# Patient Record
Sex: Female | Born: 1985
Health system: Southern US, Community
[De-identification: ages and names within clinical notes are randomized; demographics above are authoritative.]

## PROBLEM LIST (undated history)

## (undated) DIAGNOSIS — E039 Hypothyroidism, unspecified: Secondary | ICD-10-CM

## (undated) DIAGNOSIS — A749 Chlamydial infection, unspecified: Secondary | ICD-10-CM

## (undated) DIAGNOSIS — R87629 Unspecified abnormal cytological findings in specimens from vagina: Secondary | ICD-10-CM

## (undated) HISTORY — DX: Hypothyroidism, unspecified: E03.9

---
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
## 2008-10-15 ENCOUNTER — Ambulatory Visit: Payer: Self-pay | Admitting: Family Medicine

## 2008-12-31 ENCOUNTER — Ambulatory Visit: Payer: Self-pay | Admitting: Family Medicine

## 2009-01-02 DIAGNOSIS — E039 Hypothyroidism, unspecified: Secondary | ICD-10-CM

## 2009-01-02 LAB — CONVERTED CEMR LAB
BUN: 20 mg/dL (ref 6–23)
CO2: 22 meq/L (ref 19–32)
Calcium: 8.7 mg/dL (ref 8.4–10.5)
Chloride: 105 meq/L (ref 96–112)
Cholesterol: 159 mg/dL (ref 0–200)
Creatinine, Ser: 0.73 mg/dL (ref 0.40–1.20)
Glucose, Bld: 87 mg/dL (ref 70–99)
HDL: 36 mg/dL — ABNORMAL LOW (ref >39)
LDL Cholesterol: 102 mg/dL — ABNORMAL HIGH (ref 0–99)
Potassium: 4 meq/L (ref 3.5–5.3)
Prolactin: 10.4 ng/mL
Sodium: 140 meq/L (ref 135–145)
TSH: 7.208 microintl units/mL — ABNORMAL HIGH (ref 0.350–4.50)
Total CHOL/HDL Ratio: 4.4
Triglycerides: 103 mg/dL (ref <150)
VLDL: 21 mg/dL (ref 0–40)

## 2009-01-03 ENCOUNTER — Ambulatory Visit: Payer: Self-pay | Admitting: Family Medicine

## 2009-01-06 LAB — CONVERTED CEMR LAB
Free T4: 1.02 ng/dL (ref 0.89–1.80)
T3, Free: 3.7 pg/mL (ref 2.3–4.2)
TSH: 3.604 microintl units/mL (ref 0.350–4.50)

## 2010-10-28 ENCOUNTER — Ambulatory Visit: Payer: Self-pay | Admitting: Family Medicine

## 2010-10-28 LAB — CONVERTED CEMR LAB
Chlamydia, DNA Probe: NEGATIVE
Free T4: 0.9 ng/dL (ref 0.80–1.80)
GC Probe Amp, Genital: NEGATIVE
HCT: 35.4 % — ABNORMAL LOW (ref 36.0–46.0)
Hemoglobin: 11.8 g/dL — ABNORMAL LOW (ref 12.0–15.0)
Iron: 55 ug/dL (ref 42–145)
MCHC: 33.3 g/dL (ref 30.0–36.0)
MCV: 90.3 fL (ref 78.0–100.0)
Pap Smear: NEGATIVE
Platelets: 266 10*3/uL (ref 150–400)
RBC: 3.92 M/uL (ref 3.87–5.11)
RDW: 14.2 % (ref 11.5–15.5)
Saturation Ratios: 14 % — ABNORMAL LOW (ref 20–55)
T3, Free: 2.6 pg/mL (ref 2.3–4.2)
TIBC: 405 ug/dL (ref 250–470)
TSH: 16.783 microintl units/mL — ABNORMAL HIGH (ref 0.350–4.500)
Testosterone: 40.6 ng/dL (ref 10–70)
UIBC: 350 ug/dL
WBC: 7.8 10*3/uL (ref 4.0–10.5)

## 2010-10-29 ENCOUNTER — Telehealth: Payer: Self-pay | Admitting: Family Medicine

## 2010-10-29 ENCOUNTER — Encounter: Payer: Self-pay | Admitting: Family Medicine

## 2010-10-29 ENCOUNTER — Ambulatory Visit: Payer: Self-pay | Admitting: Family Medicine

## 2010-10-29 LAB — CONVERTED CEMR LAB
Free T4: 0.68 ng/dL — ABNORMAL LOW (ref 0.80–1.80)
T3, Free: 2.3 pg/mL (ref 2.3–4.2)
TSH: 13.414 microintl units/mL — ABNORMAL HIGH (ref 0.350–4.500)

## 2010-10-30 ENCOUNTER — Telehealth: Payer: Self-pay | Admitting: Family Medicine

## 2010-10-30 ENCOUNTER — Encounter: Payer: Self-pay | Admitting: Family Medicine

## 2011-01-19 NOTE — Letter (Signed)
Summary: Generic Letter  [facility]  [address]   [place], [place] [address]   Phone: [phone]  Fax: [phone]    10/30/2010  [facility] [address] [place], [place]  [address]  [place],   [place] escribo con buenas noticias acerca del Papanicolau que hicimos en la [place].  El estudio salio' negativo.    Le recomiendo que vuelva a hacerse la prueba en un ano.      Sinceramente,   Paula Compton MD  Appended Document: Generic Letter mailed

## 2011-01-19 NOTE — Assessment & Plan Note (Signed)
Summary: CPE/KH   Vital Signs:  Patient profile:   25 year old female Height:      60.5 inches Weight:      146.7 pounds BMI:     28.28 Temp:     98.3 degrees F Pulse rate:   71 / minute BP sitting:   95 / 62  Vitals Entered By: Starleen Blue RN (October 28, 2010 4:20 PM) CC: cpe Is Patient Diabetic? No Pain Assessment Patient in pain? no        CC:  cpe.  History of Present Illness: Visit conducted in [place] .  Since last visit, Heather Mejia has lost over 40 lbs intentionally.  She runs regularly and uses stairmaster. ALso watching diet.  Not eating at night anymore.  Used to work nights, now works days.   Has concern over diffuse hair loss.  Happens when she runs her hands through her hair, or combs it. No hair dye or treatment products.  Usually leaves it free, occasionally ties it back.  No focal area of loss, no excessive dandruff.  No skin changes elsewhere.   Complains of R anterior shin pain, worse when squats down or is running/stairmaster excessively.  No pain right now.  Goes away wiht rest.   Habits & Providers  Alcohol-Tobacco-Diet     Tobacco Status: never  Social History: Single, lives with both parents and siblings, son.  Father of her son is involved, not living with her. SHe works in Education officer, environmental.  Never Smoked Alcohol use-no Regular exercise-yes  Oct 28, 2010; Never smoked. LIves with son (age 51).  Implanon. Exercises regularly (lost 42# intentionally since last visit). RUns and does stairmaster.   Physical Exam  General:  well appearing, no apparent dsitress Eyes:  clear sclerae Mouth:  moist mucus membranes.  Clear oropharynx Neck:  neck supple. Thyroid enlargement noted.  No focal nodularity or adenopathy. FUll ROM Lungs:  Normal respiratory effort, chest expands symmetrically. Lungs are clear to auscultation, no crackles or wheezes. Heart:  Normal rate and regular rhythm. S1 and S2 normal without gallop, murmur, click, rub or other extra  sounds. Abdomen:  soft, nontender.  Genitalia:  normal vaginal mucosa without lesions.  Cervix smooth and not friable. PAP collected.  Copious white discharge @os .  Cervical cultures collected.  Msk:  R shin no bony tenderness; full active ROM knees and ankles bilaterally.  Pulses:  palpable dp pulses bilaterally.  Extremities:  no skin lesions or edema noted in ankles.  Neurologic:  gait normal.     Impression & Recommendations:  Problem # 1:  UNSPECIFIED ALOPECIA (ICD-704.00) Most likely telogen effluvium; will order TFTs and iron studies, RPR, as workup (especially in light of thyomegaly on exam today).  The rapid/dramatic weight loss may explain the telogen effluvium; also emotional changes.  Will discuss with her once labs are back [phone] cell).  Of note, galactorrhea is resolved since last visit.  Orders: CBC-[facility] (04540) Iron Binding Cap (TIBC)-[facility] ([id number]) Iron -[facility] [phone]) Testosterone-[facility] [phone]) TSH-[facility] [phone]) RPR-[facility] [phone]) [facility]- Est  Level 4 (03474)  Problem # 2:  THYROMEGALY (ICD-240.9)  Diffusely enlarged thyroid.  No nodules noted. TFTs today.  Decision regarding imaging once TFTs are back   Orders: [facility]- Est  Level 4 (99214)  Problem # 3:  SCREENING FOR MALIGNANT NEOPLASM OF THE CERVIX (ICD-V76.2) Patient overdue for PAP.  Implanon since early 2010.  Cervical cx collected due to discharge upon speculum exam.  Orders: Pap Smear-[facility] ([id number])  Problem # 4:  SHIN SPLINTS (ICD-844.9)  No point tenderness, resolves between heavy use periods.  No pain on exam today.  Discussed NSAIDs, stretching, avoiding overuse.  For more followup if rapid and severe recurrence.   Orders: [facility]- Est  Level 4 (95284)  Complete Medication List: 1)  Clotrimazole 1 % Crea (Clotrimazole) .... Sig: apply to affected area once daily after bathing, as directed disp 30g tube  Other Orders: Free T4-[facility] [phone]) Free T3-[facility]  [phone]) GC/Chlamydia-[facility] (87591/87491) Pap Smear (74259)  Patient Instructions: 1)  Fue un placer verle hoy.   2)  Para el dolor en la pierna derecha, quiero que se estire antes de Artist ejercicio fuerte.  Puede tomar ibuprofen 200mg , tome 2 a 4 tabletas cada 6 horas con algo de comer hasta que se le mejore.  Trate de descansar del ejercicio cuando le duele.  3)  Para la caida del pelo, estoy haciendo unas pruebas de [place].  Le llamo o escribo para comunicarle los [place], tanto de la sangre como del Papanicolau y cultivos cervicales que sacamos hoy.    Orders Added: 1)  CBC-[facility] [85027] 2)  Iron Binding Cap (TIBC)-[facility] [[address]] 3)  Iron -[facility] [[id number]] 4)  Testosterone-[facility] [60630-16010] 5)  TSH-[facility] [93235-57322] 6)  RPR-[facility] [02542-70623] 7)  Free T4-[facility] [76283-15176] 8)  Free T3-[facility] [16073-71062] 9)  GC/Chlamydia-[facility] [87591/87491] 10)  Pap Smear [88150] 11)  Pap Smear-[facility] [69485-46270] 12)  [facility]- Est  Level 4 [35009]     Vital Signs:  Patient profile:   25 year old female Height:      60.5 inches Weight:      146.7 pounds BMI:     28.28 Temp:     98.3 degrees F Pulse rate:   71 / minute BP sitting:   95 / 62  Vitals Entered By: Starleen Blue RN (October 28, 2010 4:20 PM)

## 2011-01-19 NOTE — Progress Notes (Signed)
  Phone Note Outgoing Call Call back at [facility] Phone [phone]   Call placed by: Paula Compton MD,  October 30, 2010 10:29 AM Call placed to: Patient Summary of Call: Called patient to report confirmation of primary hypothyroidism by labs.  To start LT4 daily, she wouldl ike this sent to [facility] and [employer] [facility].  Rx sent.  Patient knows to call for appt in 2 months for physician visit and for repeat TSH draw. Call conducted in Spanish. Initial call taken by: Paula Compton MD,  October 30, 2010 10:30 AM    New/Updated Medications: LEVOTHYROXINE SODIUM 100 MCG TABS (LEVOTHYROXINE SODIUM) SIG: Take 1 tab by mouth one time daily Spanish lang instructions Prescriptions: LEVOTHYROXINE SODIUM 100 MCG TABS (LEVOTHYROXINE SODIUM) SIG: Take 1 tab by mouth one time daily Spanish lang instructions  #30 x 3   Entered and Authorized by:   Paula Compton MD   Signed by:   Paula Compton MD on 10/30/2010   Method used:   Electronically to        [id number] (retail)       [address]       [place], [place]  [id number]       Ph: [phone]       Fax: [phone]   RxID:   [id number]

## 2011-01-19 NOTE — Progress Notes (Signed)
  Phone Note Outgoing Call Call back at [facility] Phone [phone]   Call placed by: Paula Compton MD,  October 29, 2010 9:34 AM Call placed to: Patient Summary of Call: [phone] cell  Called patient, conversation in Spanish with patient regarding elev TSH and low-normal free T3 and T4.  To recheck TSH, Free T4/T3, and likely initiate LT4 at daily, then recheck in 6 to 8 weeks.  Discussed with patient, will call tomorrow with the results and schedule for followup in 2 months.  She is going to [facility] today to reinstate orange card coverage.  Initial call taken by: Paula Compton MD,  October 29, 2010 9:41 AM

## 2011-02-10 ENCOUNTER — Encounter: Payer: Self-pay | Admitting: *Deleted

## 2011-02-19 ENCOUNTER — Encounter: Payer: Self-pay | Admitting: Family Medicine

## 2011-02-19 ENCOUNTER — Ambulatory Visit (INDEPENDENT_AMBULATORY_CARE_PROVIDER_SITE_OTHER): Payer: Self-pay | Admitting: Family Medicine

## 2011-02-19 VITALS — BP 95/65 | HR 56 | Temp 98.2°F | Ht 60.63 in | Wt 156.0 lb

## 2011-02-19 DIAGNOSIS — K602 Anal fissure, unspecified: Secondary | ICD-10-CM

## 2011-02-19 DIAGNOSIS — E039 Hypothyroidism, unspecified: Secondary | ICD-10-CM

## 2011-02-19 LAB — CBC
HCT: 36.1 % (ref 36.0–46.0)
Hemoglobin: 11.5 g/dL — ABNORMAL LOW (ref 12.0–15.0)
MCH: 29 pg (ref 26.0–34.0)
MCHC: 31.9 g/dL (ref 30.0–36.0)
MCV: 90.9 fL (ref 78.0–100.0)
Platelets: 260 10*3/uL (ref 150–400)
RBC: 3.97 MIL/uL (ref 3.87–5.11)
RDW: 14.6 % (ref 11.5–15.5)
WBC: 5.7 10*3/uL (ref 4.0–10.5)

## 2011-02-19 LAB — CONVERTED CEMR LAB
HCT: 36.1 % (ref 36.0–46.0)
Hemoglobin: 11.5 g/dL — ABNORMAL LOW (ref 12.0–15.0)
MCHC: 31.9 g/dL (ref 30.0–36.0)
MCV: 90.9 fL (ref 78.0–100.0)
Platelets: 260 10*3/uL (ref 150–400)
RBC: 3.97 M/uL (ref 3.87–5.11)
RDW: 14.6 % (ref 11.5–15.5)
TSH: 1.921 microintl units/mL (ref 0.350–4.500)
WBC: 5.7 10*3/uL (ref 4.0–10.5)

## 2011-02-19 LAB — TSH: TSH: 1.921 u[IU]/mL (ref 0.350–4.500)

## 2011-02-19 MED ORDER — HYDROCORTISONE 2.5 % RE CREA
TOPICAL_CREAM | RECTAL | Status: AC
Start: 2011-02-19 — End: 2012-02-19

## 2011-02-19 NOTE — Progress Notes (Signed)
  Subjective:    Patient ID: Heather Mejia, female    DOB: 1986/10/27, 25 y.o.   MRN: [id number]  HPI Visit conducted in Spanish. Heather Mejia comes in for a follow up of her hypothyroidism.  Was started on LT4 in November, set for recheck in 2 months.  Today is her next follow up. She reports that she feels better since starting LT4; hair loss is much improved, has more energy.  Sleeping better (goes to bed at 9pm, sleeps until 7am without interruption).  Feels energetic in the morning.  Denies depressive symptoms or anhedonia.  [facility]; would like to change out the Implanon now.  Has appt with [facility] for Monday, March 5th but would like to know if can be done here.  I researched and found out that [employer] is $65 for this service here.  She believes it is without charge at [employer].   LMP 02/10/2011: light flow, lasts 7 days. Somewhat irregular (may have lighter flow on some months).   Working in Education officer, environmental.  Nonsmoker.     Review of Systems Remarks that she sometimes has pain with defecation.  No rectal pain other than in the moment of active defecation.  No blood or mucus on stool.  No constipation or hard stool.  No diarrhea.     Objective:   Physical Exam  Constitutional: She appears well-developed and well-nourished.  HENT:  Head: Normocephalic and atraumatic.  Eyes: Conjunctivae are normal. Pupils are equal, round, and reactive to light. Right eye exhibits no discharge. Left eye exhibits no discharge. No scleral icterus.  Neck: Normal range of motion. Neck supple. No tracheal deviation present. No thyromegaly present.  Cardiovascular: Normal rate, regular rhythm and normal heart sounds.   Pulmonary/Chest: Effort normal. No respiratory distress. She has no wheezes. She has no rales.  Abdominal: Soft. Bowel sounds are normal.       Rectal exam: small anal fissure at 6 oclock; no masses, no hemorrhoids on DRE or on inspection. No blood on  glove.   Musculoskeletal:       No ankle edema bilaterally. Palpable dp pulses bilaterally  Lymphadenopathy:    She has no cervical adenopathy.          Assessment & Plan:

## 2011-02-19 NOTE — Assessment & Plan Note (Signed)
Patient reports improved symptoms since starting LT4.  For lab check of TSH today. May adjust meds according to results. She agrees to this plan.

## 2011-02-19 NOTE — Patient Instructions (Addendum)
Fue un Research officer, trade union.  Estamos chequeando los laboratorios de la tiroide, el conteo de [place].   Le llamo al [phone] para darle los [place].   Averigue' acerca del implanon.  Para ponerselo aqui, hay un cobro de $65.  Si quiere hacerlo aqui, hay que marcar una cita con un doctor que pone el implanon.   Para la fisura anal: Mande' una receta para una crema, para aplicarse dos veces por dia.  Tambien, sientese en la banadera con agua tibia con sales de [employer] (sales curativas) dos a tres veces por dia.  Coma fibra (en forma de polvo/suplemento como Metamucil), tambien por la alimentacion.  Quiero que vuelva en entre 1 y 2 meses.   FOLLOW UP WITH DR Mauricio Po IN 1 TO 2 MONTHS.  IF PATIENT DESIRES IMPLANON TO BE CHANGED HERE IN [facility], TO MAKE APPT WITH PHYSICIAN WHO DOES IMPLANON.

## 2011-02-19 NOTE — Assessment & Plan Note (Addendum)
Anal fissure at 6 oclock on exam.  No internal or external hemorrhoids.  No report of bloody stool.  Will prescribe anusol HC, fiber and sitz baths.  Follow up in 1-2 months.

## 2011-02-22 ENCOUNTER — Telehealth: Payer: Self-pay | Admitting: Family Medicine

## 2011-02-22 MED ORDER — LEVOTHYROXINE SODIUM 100 MCG PO TABS: 100.0000 ug | ORAL_TABLET | Freq: Every day | ORAL | Status: DC

## 2011-02-22 NOTE — Telephone Encounter (Signed)
Call completed, conversation in [place].  Related results of TSH to patient; well controlled hypothyroidism, will continue with same dose of LT4, recheck in 1 year.  Also, patient does not have another appointment with me.  She did not pick up the HC cream at the pharmacy.  I asked her to please call if her pain with defecation is not resolved within 2 weeks.  She agrees.

## 2011-02-25 ENCOUNTER — Ambulatory Visit
Admission: RE | Admit: 2011-02-25 | Discharge: 2011-02-25 | Disposition: A | Discharge status: Home / Self Care | Payer: No Typology Code available for payment source | Source: Ambulatory Visit | Attending: Infectious Diseases | Admitting: Infectious Diseases

## 2011-02-25 ENCOUNTER — Other Ambulatory Visit: Payer: Self-pay | Admitting: Infectious Diseases

## 2011-02-25 DIAGNOSIS — R7611 Nonspecific reaction to tuberculin skin test without active tuberculosis: Secondary | ICD-10-CM

## 2012-10-10 ENCOUNTER — Encounter: Payer: Self-pay | Admitting: Family Medicine

## 2012-10-10 ENCOUNTER — Ambulatory Visit (INDEPENDENT_AMBULATORY_CARE_PROVIDER_SITE_OTHER): Payer: Self-pay | Admitting: Family Medicine

## 2012-10-10 VITALS — BP 123/76 | HR 58 | Ht 65.0 in | Wt 162.6 lb

## 2012-10-10 DIAGNOSIS — E039 Hypothyroidism, unspecified: Secondary | ICD-10-CM

## 2012-10-10 MED ORDER — LEVOTHYROXINE SODIUM 100 MCG PO TABS: 100.0000 ug | ORAL_TABLET | Freq: Every day | ORAL | Status: DC

## 2012-10-10 NOTE — Patient Instructions (Addendum)
fue un placer verle hoy.   Quiero que vuelva a tomar la levotiroxina una tableta diario.  Leanna Sato cita en 2 meses para el Papanicolau y para que le chequemos el laboratorio de la tiroide de [place].  Haga las averiguaciones para renovar la tarjeta anaranjada.  APPOINTMENT IN 2 MONTHS WITH DR Mauricio Po FOR PAP SMEAR.

## 2012-10-11 NOTE — Assessment & Plan Note (Signed)
Patient with symptoms of hypothyroidism; she has been off her meds for several months (possibly over a year).  We discussed the possibility of checking TSH now and then recheck after reinstating therapy, or just waiting to check after she's been on the LT4 for 6-8 weeks.  We opted for just waiting to see how she levels out on the medicine for 6-8 weeks, then checking TSH and free T4.  In the interim, she will work on getting her [employer].  Discussed the low likelihood of thyroid cancer in a 20-something woman with hypothyroidism.

## 2012-10-11 NOTE — Progress Notes (Signed)
  Subjective:    Patient ID: Heather Mejia, female    DOB: 15-Jun-1986, 26 y.o.   MRN: [id number]  HPI Visit conducted in Spanish.  Heather Mejia presents today with concern about her thyroid.  She admits to not taking her LT4 for several months; she had only taken it for about 3 months after her last visit  Does have some mild hair loss, emotional lability.  Perhaps some fullness in her throat lately, but no dysphagia.  She had Explanon placed in health department in April 02, 2011, has very light bleeding which she ascribes to menses.  Has had very light bleeding for the past 2 weeks or so.   She expresses concern for whether her thyroid condition could be related to cancer.  She was diagnosed with hypothyroidism 3 years ago.    Review of SystemsNo family history of thyroid cancer.     Objective:   Physical Exam Well appearing, no apparent distress HEENT Neck supple; mildly enlarged thyroid without focal nodularity. No cervical adenopathy. Clear oropharynx.        Assessment & Plan:

## 2012-11-29 ENCOUNTER — Ambulatory Visit: Payer: Self-pay | Admitting: Family Medicine

## 2012-12-01 ENCOUNTER — Encounter: Payer: Self-pay | Admitting: Family Medicine

## 2012-12-01 ENCOUNTER — Ambulatory Visit (INDEPENDENT_AMBULATORY_CARE_PROVIDER_SITE_OTHER): Payer: Self-pay | Admitting: Family Medicine

## 2012-12-01 ENCOUNTER — Other Ambulatory Visit (HOSPITAL_COMMUNITY)
Admission: RE | Admit: 2012-12-01 | Discharge: 2012-12-01 | Disposition: A | Discharge status: Home / Self Care | Payer: Self-pay | Source: Ambulatory Visit | Attending: Family Medicine | Admitting: Family Medicine

## 2012-12-01 VITALS — BP 123/74 | HR 62 | Ht 61.0 in | Wt 161.0 lb

## 2012-12-01 DIAGNOSIS — N898 Other specified noninflammatory disorders of vagina: Secondary | ICD-10-CM

## 2012-12-01 DIAGNOSIS — E039 Hypothyroidism, unspecified: Secondary | ICD-10-CM

## 2012-12-01 DIAGNOSIS — Z113 Encounter for screening for infections with a predominantly sexual mode of transmission: Secondary | ICD-10-CM | POA: Insufficient documentation

## 2012-12-01 DIAGNOSIS — Z01419 Encounter for gynecological examination (general) (routine) without abnormal findings: Secondary | ICD-10-CM | POA: Insufficient documentation

## 2012-12-01 DIAGNOSIS — Z124 Encounter for screening for malignant neoplasm of cervix: Secondary | ICD-10-CM

## 2012-12-01 LAB — TSH: TSH: 0.074 u[IU]/mL — ABNORMAL LOW (ref 0.350–4.500)

## 2012-12-01 LAB — T4, FREE: Free T4: 1.65 ng/dL (ref 0.80–1.80)

## 2012-12-01 NOTE — Progress Notes (Signed)
  Subjective:    Patient ID: Heather Mejia, female    DOB: 1986-03-04, 26 y.o.   MRN: [id number]  HPI Visit in Spanish.  She is here for PAP smear, last PAP was normal in 2011 (see Prevention tab).  Has explanon for contraception. With current partner for the past 1 year.  G1P1.  LMP started around Dec 1, lasted for 1 week (ended earlier this week).   Patient has been taking her LT4 as directed since her last visit.  We had planned to recheck her thyroid studies today after she had been established on the med for at least 2 months.     Review of Systems  No fevers or chills; some dryness in the back of her throat sometimes. No vaginal discharge.  No known STI in the past.      Objective:   Physical Exam Well appearing, no apparent distress HEENT Neck supple. Mild erythema along oropharynx with cobblestoning. No exudates.  No cervical adenopathy Thyroid supple and non-nodular.  GYN: Normal vaginal mucosa.  Some white vaginal discharge on speculum exam. Normal appearing cervix.  Bimanual with no CMT, no adnexal tenderness or masses appreciated.        Assessment & Plan:

## 2012-12-01 NOTE — Patient Instructions (Addendum)
Fue un Research officer, trade union.  Le hicimos el Papanicolau hoy, le contacto con el resultado.   Tambien estamos [employer] de la tiroide hoy.   NEEDS APPT WITH [facility] FOR PEACH/ORANGE CARD ELIGIBILITY, RENEWAL.

## 2012-12-01 NOTE — Assessment & Plan Note (Signed)
Reports being stable on her medications daily, not forgetting doses.  To recheck TSH today.  I have explained to her that I will be away starting next week, therefore I ask that she please call for the results of her thyroid studies.  If medication dosing changes are required, I will be happy to do this upon my return.

## 2012-12-03 ENCOUNTER — Other Ambulatory Visit: Payer: Self-pay | Admitting: Family Medicine

## 2012-12-03 ENCOUNTER — Encounter: Payer: Self-pay | Admitting: Family Medicine

## 2012-12-03 MED ORDER — LEVOTHYROXINE SODIUM 50 MCG PO TABS: 50.0000 ug | ORAL_TABLET | Freq: Every day | ORAL | Status: DC

## 2012-12-07 ENCOUNTER — Telehealth: Payer: Self-pay | Admitting: *Deleted

## 2012-12-07 NOTE — Telephone Encounter (Signed)
Message copied by Jennette Bill on Thu Dec 07, 2012  5:24 PM ------      Message from: [place], [place]      Created: Wed Dec 06, 2012 11:41 AM      Regarding: RE: Tx       I called a pt and LVM hopefully pt will call us back soon.      MJ       ----- Message -----         From: Dorice Lamas, CMA         Sent: 12/06/2012  10:51 AM           To: Marines Jackson      Subject: Tx                                                       Please call patient to schedule nurse visit for treatment for positive chlamydia result.Nyilah Kight, Rodena Medin

## 2012-12-07 NOTE — Telephone Encounter (Signed)
Unable to reach patient by phone, faxed results to [facility].Busick, Rodena Medin

## 2012-12-08 ENCOUNTER — Encounter: Payer: Self-pay | Admitting: Family Medicine

## 2012-12-11 ENCOUNTER — Ambulatory Visit (INDEPENDENT_AMBULATORY_CARE_PROVIDER_SITE_OTHER): Payer: Self-pay | Admitting: Family Medicine

## 2012-12-11 DIAGNOSIS — A749 Chlamydial infection, unspecified: Secondary | ICD-10-CM

## 2012-12-11 MED ORDER — AZITHROMYCIN 1 G PO PACK
1.0000 g | PACK | Freq: Once | ORAL | Status: AC
  Administered 2012-12-11: 1 g via ORAL

## 2012-12-11 NOTE — Progress Notes (Signed)
Patient ID: Heather Mejia, female   DOB: [birth year], 26 y.o.   MRN: [id number] Nurse visit only.

## 2013-03-09 ENCOUNTER — Encounter: Payer: Self-pay | Admitting: Family Medicine

## 2013-03-09 ENCOUNTER — Other Ambulatory Visit: Payer: Self-pay | Admitting: Family Medicine

## 2013-03-09 ENCOUNTER — Ambulatory Visit (INDEPENDENT_AMBULATORY_CARE_PROVIDER_SITE_OTHER): Payer: 59 | Admitting: Family Medicine

## 2013-03-09 VITALS — BP 118/74 | HR 58 | Wt 162.0 lb

## 2013-03-09 DIAGNOSIS — J029 Acute pharyngitis, unspecified: Secondary | ICD-10-CM

## 2013-03-09 DIAGNOSIS — A5602 Chlamydial vulvovaginitis: Secondary | ICD-10-CM | POA: Insufficient documentation

## 2013-03-09 DIAGNOSIS — N72 Inflammatory disease of cervix uteri: Secondary | ICD-10-CM

## 2013-03-09 DIAGNOSIS — E039 Hypothyroidism, unspecified: Secondary | ICD-10-CM

## 2013-03-09 DIAGNOSIS — A5609 Other chlamydial infection of lower genitourinary tract: Secondary | ICD-10-CM

## 2013-03-09 DIAGNOSIS — N771 Vaginitis, vulvitis and vulvovaginitis in diseases classified elsewhere: Secondary | ICD-10-CM

## 2013-03-09 LAB — RPR

## 2013-03-09 LAB — HIV ANTIBODY (ROUTINE TESTING W REFLEX): HIV: NONREACTIVE

## 2013-03-09 LAB — TSH: TSH: 1.349 u[IU]/mL (ref 0.350–4.500)

## 2013-03-09 LAB — T4, FREE: Free T4: 1.34 ng/dL (ref 0.80–1.80)

## 2013-03-09 MED ORDER — AZITHROMYCIN 1 G PO PACK: 1.0000 g | PACK | Freq: Once | ORAL | Status: DC

## 2013-03-09 MED ORDER — CEFTRIAXONE SODIUM 250 MG IJ SOLR
250.0000 mg | Freq: Once | INTRAMUSCULAR | Status: AC
  Administered 2013-03-09: 250 mg via INTRAMUSCULAR

## 2013-03-09 MED ORDER — AZITHROMYCIN 500 MG PO TABS
1000.0000 mg | ORAL_TABLET | Freq: Once | ORAL | Status: AC
  Administered 2013-03-09: 1000 mg via ORAL

## 2013-03-09 MED ORDER — CEFTRIAXONE SODIUM 250 MG IJ SOLR: 250.0000 mg | Freq: Once | INTRAMUSCULAR | Status: DC

## 2013-03-09 NOTE — Progress Notes (Signed)
  Subjective:    Patient ID: Tsosie Billing, female    DOB: 12-03-1986, 27 y.o.   MRN: [id number]  HPI Visit in Spanish. Here for follow up of a few issues:  1. Thyroid medication.  Has been taking 1/2 dose as per the letter from December, here for recheck of TSH.  No unintended weight loss, feels that the thyroid issue is well controlled.  2. Diagnosed with Chlamydia on cervical culture in December, received DOT in our office with azithro 1g then.  She has continued to be sexually active with same female partner since then, he was untreated and does not have a doctor.  She is concerned she is reinfected, although she has not had vaginal discharge.  She has not been with anyone else since beginning this relationship about 1 year ago.  Is using Nexplanon for contraception.  3. Has had some irritation in her mouth for several months, sore throat.  "Granitos" (pimple-like lesions) along her palate.  No fevers or chills.  Was worse when she had cold-like symptoms. Continues with uncomfortable irritation in mouth.    Review of SystemsSee HPI.  Denies vaginal discharge, no dysuria or polyuria.      Objective:   Physical Exam Well appearing, no apparent distress HEENT Neck supple, no cervical adenopathy. Cobblestoning oropharynx without exudate.  Puncate erythema along hard palate.  Moist mucus membranes, no aphthous lesions. Dentition intact.  Patient declines speculum exam/wet prep.       Assessment & Plan:

## 2013-03-09 NOTE — Assessment & Plan Note (Signed)
To recheck TSH and free T4 today, as her TSH in Dec was low.  Readjust medicine after seeing the repeat TSH value.

## 2013-03-09 NOTE — Assessment & Plan Note (Signed)
Patient recently treated for positive Chlamydia cervical culture in Dec 2013.  Since then has been sexually active with same partner.  I stressed to her today that he needs to be treated, can either register for treatment in our office or else contact the [facility] of [facility] (contact info given to her in writing).  Must refrain from intercourse until both of them are treated. She declines offer for repeat cervical culture today, does not have sx now and did not have sx at the time of first (positive) screen.  Also, will get HIV and RPR today with patient's verbal consent, explained reasoning for screening today.  She has had HIV screening (negative) years ago.  Her cell for contact about results is [phone].

## 2013-03-09 NOTE — Patient Instructions (Addendum)
Fue un Research officer, trade union.   Recibio' hoy tratamiento para la infeccion de Chlamydia que fue diagnosticado (y tratado) en diciembre.  Ademas del tratamiento para la Chlamydia, tambien le dimos un antibiotico para Gonorrhea (ceftriaxone 250mg , inyectable).  Es sumamente importante que su pareja se trate para esta condicion, y que no vuelvan a Child psychotherapist sexuales [employer] no se hayan tratado [place].  El puede presentarse para evaluacion en: [facility] (Tratamiento para enfermedades venereas) [address] [place], [place]. [phone]  Abierto de lunes a viernes, 8am hasta las 5pm.  Le llamo la semana que viene al cel. [phone] con los resultados de todas las pruebas (las de la tiroide, el cultivo de Advertising copywriter, y la prueba de VIH).  Quiero verle de nuevo en el consultorio en un mes.  FOLLOW UP WITH DR Mauricio Po IN 3 TO 5 WEEKS.

## 2013-03-11 LAB — CULTURE, GROUP A STREP: Organism ID, Bacteria: NORMAL

## 2013-03-12 ENCOUNTER — Telehealth: Payer: Self-pay | Admitting: Family Medicine

## 2013-03-12 NOTE — Telephone Encounter (Signed)
Is asking for results of her labs

## 2013-03-12 NOTE — Telephone Encounter (Signed)
Called pt. Waiting for call back. Please tell pt: Labs are not back completely. Dr.Breen will review them. Heather Mejia, Heather Mejia

## 2013-03-13 NOTE — Telephone Encounter (Signed)
Call to patient completed in Spanish; notified patient of negative HIV/RPR and thyroid studies which indicate she is on proper dose of levothyroxine.  To continue with current dose.  She says her throat is improved.  JB

## 2013-03-14 LAB — CULTURE, UPPER RESPIRATORY: Organism ID, Bacteria: NORMAL

## 2013-10-16 ENCOUNTER — Other Ambulatory Visit: Payer: Self-pay | Admitting: Family Medicine

## 2014-06-11 ENCOUNTER — Ambulatory Visit (INDEPENDENT_AMBULATORY_CARE_PROVIDER_SITE_OTHER): Payer: 59 | Admitting: Family Medicine

## 2014-06-11 ENCOUNTER — Encounter: Payer: Self-pay | Admitting: Family Medicine

## 2014-06-11 VITALS — BP 107/69 | HR 54 | Ht 61.0 in | Wt 157.0 lb

## 2014-06-11 DIAGNOSIS — E039 Hypothyroidism, unspecified: Secondary | ICD-10-CM

## 2014-06-11 LAB — TSH: TSH: 1.173 u[IU]/mL (ref 0.350–4.500)

## 2014-06-11 NOTE — Progress Notes (Signed)
   Subjective:    Patient ID: Heather Mejia, female    DOB: 1985/12/31, 28 y.o.   MRN: [id number]  HPI Visit in Spanish.  Patient is here for follow up of her hypothyroidism.  Feels well; no changes in weight. Denies skin dryness, hair loss or constipation.   Patient is taking LT4 50mcg daily (has been taking 1/2 tab of 100mcg).    [facility] 2 weeks ago to have Implanon removed and had Pap smear then.  She reports that she had been tested positive for Chlamydia and was treated. She remains sexually active with the same partner she was with last year when she was positive and treated for Chlamydia.  She is monogamous with him; when asked if she has doubts about his faithfulness to her, she is uncertain how to answer. She reports that she was also tested for HIV and was negative at [facility].   Denies tobacco use or alcohol use.   Review of Systems     Objective:   Physical Exam Well appearing, no apparent distress HEENT neck supple, no cervical adenopathy.  No thyroid tenderness or nodularity.  COR Regular S1S2, no extra sounds PULM Clear bilaterally, no rales or wheezes       Assessment & Plan:

## 2014-06-11 NOTE — Patient Instructions (Signed)
Fue un Research officer, trade unionplacer verle hoy.  Estamos chequeando el laboratorio para la tiroide hoy. Le llamo al cel. [phone] con el resultado.  Si todo esta' bien, mando otra receta para levothyroxine 50mcg diario (en vez de partir una tableta de 100mcg a la mitad).  Quiero volver a verle en 12 meses para otro chequeo.

## 2014-06-12 ENCOUNTER — Telehealth: Payer: Self-pay | Admitting: Family Medicine

## 2014-06-12 MED ORDER — LEVOTHYROXINE SODIUM 50 MCG PO TABS: 50.0000 ug | ORAL_TABLET | Freq: Every day | ORAL | Status: DC

## 2014-06-12 NOTE — Assessment & Plan Note (Signed)
For TSH check today; will decide on need to adjust LT4 dose based on results of the TSH.

## 2014-06-12 NOTE — Telephone Encounter (Signed)
Called patient to report results, we will not change her current dose of LT4  daily. New Rx sent for #90 day supply.  JB

## 2014-11-01 ENCOUNTER — Other Ambulatory Visit: Payer: Self-pay | Admitting: Family Medicine

## 2014-11-04 MED ORDER — LEVOTHYROXINE SODIUM 50 MCG PO TABS: 50.0000 ug | ORAL_TABLET | Freq: Every day | ORAL | Status: DC

## 2014-11-04 NOTE — Telephone Encounter (Signed)
Needs refill on synthoid

## 2015-10-20 ENCOUNTER — Other Ambulatory Visit (HOSPITAL_COMMUNITY): Payer: Self-pay | Admitting: Nurse Practitioner

## 2015-10-20 DIAGNOSIS — Z3682 Encounter for antenatal screening for nuchal translucency: Secondary | ICD-10-CM

## 2015-10-20 DIAGNOSIS — Z8279 Family history of other congenital malformations, deformations and chromosomal abnormalities: Secondary | ICD-10-CM

## 2015-10-20 LAB — OB RESULTS CONSOLE ABO/RH: RH TYPE: POSITIVE

## 2015-10-20 LAB — OB RESULTS CONSOLE HGB/HCT, BLOOD
HEMATOCRIT: 36 %
Hemoglobin: 12.3 g/dL

## 2015-10-20 LAB — OB RESULTS CONSOLE PLATELET COUNT: Platelets: 231 10*3/uL

## 2015-10-20 LAB — OB RESULTS CONSOLE RPR: RPR: NONREACTIVE

## 2015-10-20 LAB — OB RESULTS CONSOLE GC/CHLAMYDIA
Chlamydia: NEGATIVE
Gonorrhea: NEGATIVE

## 2015-10-20 LAB — OB RESULTS CONSOLE HEPATITIS B SURFACE ANTIGEN: HEP B S AG: NEGATIVE

## 2015-10-20 LAB — OB RESULTS CONSOLE ANTIBODY SCREEN: Antibody Screen: NEGATIVE

## 2015-10-20 LAB — OB RESULTS CONSOLE VARICELLA ZOSTER ANTIBODY, IGG: VARICELLA IGG: NON-IMMUNE/NOT IMMUNE

## 2015-10-20 LAB — OB RESULTS CONSOLE RUBELLA ANTIBODY, IGM: RUBELLA: IMMUNE

## 2015-10-30 ENCOUNTER — Encounter (HOSPITAL_COMMUNITY): Payer: Self-pay

## 2015-10-30 ENCOUNTER — Ambulatory Visit (HOSPITAL_COMMUNITY)
Admission: RE | Admit: 2015-10-30 | Discharge: 2015-10-30 | Disposition: A | Discharge status: Home / Self Care | Payer: Medicaid Other | Source: Ambulatory Visit | Attending: Nurse Practitioner | Admitting: Nurse Practitioner

## 2015-10-30 ENCOUNTER — Other Ambulatory Visit (HOSPITAL_COMMUNITY): Payer: Self-pay | Admitting: Nurse Practitioner

## 2015-10-30 VITALS — BP 115/64 | HR 61 | Wt 166.0 lb

## 2015-10-30 DIAGNOSIS — Z8279 Family history of other congenital malformations, deformations and chromosomal abnormalities: Secondary | ICD-10-CM

## 2015-10-30 DIAGNOSIS — O30031 Twin pregnancy, monochorionic/diamniotic, first trimester: Secondary | ICD-10-CM | POA: Insufficient documentation

## 2015-10-30 DIAGNOSIS — O99280 Endocrine, nutritional and metabolic diseases complicating pregnancy, unspecified trimester: Secondary | ICD-10-CM | POA: Diagnosis not present

## 2015-10-30 DIAGNOSIS — O30039 Twin pregnancy, monochorionic/diamniotic, unspecified trimester: Secondary | ICD-10-CM

## 2015-10-30 DIAGNOSIS — E079 Disorder of thyroid, unspecified: Secondary | ICD-10-CM

## 2015-10-30 DIAGNOSIS — Z3682 Encounter for antenatal screening for nuchal translucency: Secondary | ICD-10-CM

## 2015-10-30 DIAGNOSIS — Z3A13 13 weeks gestation of pregnancy: Secondary | ICD-10-CM

## 2015-10-30 DIAGNOSIS — Z315 Encounter for genetic counseling: Secondary | ICD-10-CM | POA: Insufficient documentation

## 2015-10-30 DIAGNOSIS — O99281 Endocrine, nutritional and metabolic diseases complicating pregnancy, first trimester: Secondary | ICD-10-CM

## 2015-10-30 DIAGNOSIS — Z36 Encounter for antenatal screening of mother: Secondary | ICD-10-CM | POA: Insufficient documentation

## 2015-10-30 DIAGNOSIS — O352XX0 Maternal care for (suspected) hereditary disease in fetus, not applicable or unspecified: Secondary | ICD-10-CM | POA: Insufficient documentation

## 2015-10-30 NOTE — Progress Notes (Addendum)
Genetic Counseling  High-Risk Gestation Note  Appointment Date:  10/30/2015 Referred By: Trina Ao, NP Date of Birth:  1986-03-14   Pregnancy History: G2P1001 Estimated Date of Delivery: 05/05/16 Estimated Gestational Age: [redacted]w[redacted]d Attending: Particia Nearing, MD    I met with Heather Mejia for genetic counseling because of a previous child with polydactyly of the foot. [employer] Spanish/English interpreter was present for today's session.   In Summary:   Monochorionic/diamniotic twin gestation visualized on today's ultrasound  Patient's previous son born with unilateral polysyndactyly of the foot; Different father from current pregnancy  Reported family history most consistent with isolated polysyndactyly, which can occur as familial autosomal dominant trait with reduced penetrance  Recurrence risk for current pregnancy is low but could be up to 50%  Patient elected to pursue first trimester screening  Follow-up ultrasounds are scheduled for patient  We began by reviewing the family history in detail. The patient reported that her son, Heather Mejia, was born with toe differences on his right foot. She described him to have an additional post-axial toe and that it was partially fused to the toe next to it. He has not had medical or surgical treatment for this. He has no digit abnormalities of his other foot of his hands. He is currently 27 years old and reportedly healthy. The patient reported that he has a different father from the current pregnancy. Heather Mejia also reportedly has a paternal aunt (unrelated to the current pregnancy) who was had post-axial polydactyly of the hand.   Polydactyly and syndactyly is typically an isolated trait that can occur sporadically in a person or inherited as a familial trait.  When inherited as an isolated trait, autosomal dominant inheritance is observed, meaning each pregnancy of a parent with polydactyly has a 50% (1 in 2) chance to  inherit this genetic predisposition for polydactyly.  Not all individuals that inherit this predisposition would be born with polydactyly, but they would still be at increased risk to have affected children.  Less commonly, polydactyly may be one feature of an underlying genetic condition that may or may not be inherited. Given the reported family history of apparently isolated polysyndactyly in the patient's son and additional paternal relative for him with apparently isolated polydactyly, recurrence risk to the current pregnancy is likely low. However, recurrence risk could be up to 50% and if it were due to a different underlying cause, recurrence risk estimate may change. Targeted ultrasound is available to assess for polydactyly in the pregnancy. The patient understands that ultrasound cannot rule out all birth defects prenatally.  The family histories were otherwise found to be noncontributory for birth defects, mental retardation, and known genetic conditions. Without further information regarding the provided family history, an accurate genetic risk cannot be calculated. Further genetic counseling is warranted if more information is obtained.  Patient also elected to pursue first trimester screening today. Monochorionic/diamniotic twin gestation was visualized today. She understands that screening tests are used to modify a patient's a priori risk for aneuploidy, typically based on age.  This estimate provides a pregnancy specific risk assessment.  Heather Mejia denied exposure to environmental toxins or chemical agents. She denied the use of alcohol, tobacco or street drugs. She denied significant viral illnesses during the course of her pregnancy. Her medical and surgical histories were noncontributory.   I counseled Heather Mejia regarding the above risks and available options.  The approximate face-to-face time with the genetic counselor was 25 minutes.  Chipper Oman, MS Certified Genetic Counselor 10/30/2015

## 2015-11-14 ENCOUNTER — Other Ambulatory Visit (HOSPITAL_COMMUNITY): Payer: Self-pay

## 2015-11-19 ENCOUNTER — Encounter: Payer: Self-pay | Admitting: *Deleted

## 2015-11-19 ENCOUNTER — Ambulatory Visit (INDEPENDENT_AMBULATORY_CARE_PROVIDER_SITE_OTHER): Payer: 59 | Admitting: Advanced Practice Midwife

## 2015-11-19 ENCOUNTER — Encounter: Payer: Self-pay | Admitting: Advanced Practice Midwife

## 2015-11-19 VITALS — BP 109/52 | HR 71 | Temp 98.8°F | Ht 59.0 in | Wt 167.4 lb

## 2015-11-19 DIAGNOSIS — Z23 Encounter for immunization: Secondary | ICD-10-CM | POA: Diagnosis not present

## 2015-11-19 DIAGNOSIS — O30032 Twin pregnancy, monochorionic/diamniotic, second trimester: Secondary | ICD-10-CM | POA: Diagnosis not present

## 2015-11-19 DIAGNOSIS — E039 Hypothyroidism, unspecified: Secondary | ICD-10-CM | POA: Diagnosis not present

## 2015-11-19 DIAGNOSIS — O99282 Endocrine, nutritional and metabolic diseases complicating pregnancy, second trimester: Secondary | ICD-10-CM | POA: Diagnosis not present

## 2015-11-19 DIAGNOSIS — O09892 Supervision of other high risk pregnancies, second trimester: Secondary | ICD-10-CM | POA: Diagnosis not present

## 2015-11-19 DIAGNOSIS — O0990 Supervision of high risk pregnancy, unspecified, unspecified trimester: Secondary | ICD-10-CM | POA: Insufficient documentation

## 2015-11-19 LAB — POCT URINALYSIS DIP (DEVICE)
Bilirubin Urine: NEGATIVE
Glucose, UA: NEGATIVE mg/dL
Ketones, ur: NEGATIVE mg/dL
Leukocytes, UA: NEGATIVE
Nitrite: NEGATIVE
Protein, ur: NEGATIVE mg/dL
Specific Gravity, Urine: 1.02 (ref 1.005–1.030)
Urobilinogen, UA: 0.2 mg/dL (ref 0.0–1.0)
pH: 7 (ref 5.0–8.0)

## 2015-11-19 MED ORDER — LEVOTHYROXINE SODIUM 50 MCG PO TABS: 50.0000 ug | ORAL_TABLET | Freq: Every day | ORAL | Status: DC

## 2015-11-19 NOTE — Patient Instructions (Signed)

## 2015-11-19 NOTE — Progress Notes (Signed)
Here today for first visit[facility] States has not taken synthroid for about 4 weeks because she has not gotten it from pharmacy because she keeps forgetting/busy.  Given new patient information.

## 2015-11-19 NOTE — Progress Notes (Signed)
Subjective:  Heather Mejia is a 29 y.o. G2P1001 at 7477w0d being seen today [facility]  She is currently monitored for the following issues for this high-risk pregnancy and has UNSPECIFIED HYPOTHYROIDISM; Anal fissure; Pap smear for cervical cancer screening; Chlamydia vaginitis/cervicitis; Acute pharyngitis; Previous child with congenital anomaly, currently pregnant, antepartum; Supervision of other high risk pregnancies, second trimester; and Monochorionic diamniotic twin pregnancy in second trimester on her problem list.  Patient reports no complaints.  Contractions: Not present. Vag. Bleeding: None.  Movement: Present. Denies leaking of fluid.   The following portions of the patient's history were reviewed and updated as appropriate: allergies, current medications, past family history, past medical history, past social history, past surgical history and problem list. Problem list updated.  Objective:   Filed Vitals:   11/19/15 0853 11/19/15 0855  BP: 109/52   Pulse: 71   Temp: 98.8 F (37.1 C)   Height:  4\' 11"  (1.499 m)  Weight: 167 lb 6.4 oz (75.932 kg)     Fetal Status: Fetal Heart Rate (bpm): 152/159   Movement: Present     General:  Alert, oriented and cooperative. Patient is in no acute distress.  Skin: Skin is warm and dry. No rash noted.   Cardiovascular: Normal heart rate noted  Respiratory: Normal respiratory effort, no problems with respiration noted  Abdomen: Soft, gravid, appropriate for gestational age. Pain/Pressure: Absent     Pelvic: Vag. Bleeding: None     Cervical exam deferred        Extremities: Normal range of motion.  Edema: None  Mental Status: Normal mood and affect. Normal behavior. Normal judgment and thought content.   Urinalysis: Urine Protein: Negative Urine Glucose: Negative  Assessment and Plan:  Pregnancy: G2P1001 at 3077w0d  1. Supervision of other high risk pregnancies,  second trimester   2. Monochorionic diamniotic twin pregnancy in second trimester   3. Hypothyroid in pregnancy, antepartum, second trimester  - levothyroxine (SYNTHROID, LEVOTHROID) 50 MCG tablet; Take 1 tablet (50 mcg total) by mouth daily.  Dispense: 30 tablet; Refill: 4  Preterm labor symptoms and general obstetric precautions including but not limited to vaginal bleeding, contractions, leaking of fluid and fetal movement were reviewed in detail with the patient. Please refer to After Visit Summary for other counseling recommendations.  Has US tomorrow and Q2 weeks to monitor to TTTS.  Discussed schedule of care and testign for M/D twins [facility] Return in about 4 weeks (around 12/17/2015) for Move to [facility]/ twins/needs SW /nutrition.   Dorathy KinsmanVirginia Johm Pfannenstiel, CNM

## 2015-11-20 ENCOUNTER — Encounter (HOSPITAL_COMMUNITY): Payer: Self-pay

## 2015-11-20 ENCOUNTER — Other Ambulatory Visit (HOSPITAL_COMMUNITY): Payer: Self-pay | Admitting: Maternal and Fetal Medicine

## 2015-11-20 ENCOUNTER — Ambulatory Visit (HOSPITAL_COMMUNITY)
Admission: RE | Admit: 2015-11-20 | Discharge: 2015-11-20 | Disposition: A | Discharge status: Home / Self Care | Payer: Medicaid Other | Source: Ambulatory Visit | Attending: Nurse Practitioner | Admitting: Nurse Practitioner

## 2015-11-20 DIAGNOSIS — O30032 Twin pregnancy, monochorionic/diamniotic, second trimester: Secondary | ICD-10-CM | POA: Insufficient documentation

## 2015-11-20 DIAGNOSIS — Z8279 Family history of other congenital malformations, deformations and chromosomal abnormalities: Secondary | ICD-10-CM | POA: Diagnosis not present

## 2015-11-20 DIAGNOSIS — O30039 Twin pregnancy, monochorionic/diamniotic, unspecified trimester: Secondary | ICD-10-CM

## 2015-11-20 DIAGNOSIS — E039 Hypothyroidism, unspecified: Secondary | ICD-10-CM | POA: Diagnosis not present

## 2015-11-20 DIAGNOSIS — O99280 Endocrine, nutritional and metabolic diseases complicating pregnancy, unspecified trimester: Secondary | ICD-10-CM | POA: Diagnosis not present

## 2015-11-20 DIAGNOSIS — Z3A16 16 weeks gestation of pregnancy: Secondary | ICD-10-CM | POA: Insufficient documentation

## 2015-11-27 ENCOUNTER — Encounter: Payer: 59 | Admitting: Obstetrics & Gynecology

## 2015-12-04 ENCOUNTER — Encounter (HOSPITAL_COMMUNITY): Payer: Self-pay

## 2015-12-04 ENCOUNTER — Other Ambulatory Visit (HOSPITAL_COMMUNITY): Payer: Self-pay | Admitting: Maternal and Fetal Medicine

## 2015-12-04 ENCOUNTER — Ambulatory Visit (HOSPITAL_COMMUNITY)
Admission: RE | Admit: 2015-12-04 | Discharge: 2015-12-04 | Disposition: A | Discharge status: Home / Self Care | Payer: Medicaid Other | Source: Ambulatory Visit | Attending: Advanced Practice Midwife | Admitting: Advanced Practice Midwife

## 2015-12-04 VITALS — BP 113/56 | HR 68 | Wt 171.2 lb

## 2015-12-04 DIAGNOSIS — Z36 Encounter for antenatal screening of mother: Secondary | ICD-10-CM | POA: Insufficient documentation

## 2015-12-04 DIAGNOSIS — E039 Hypothyroidism, unspecified: Secondary | ICD-10-CM

## 2015-12-04 DIAGNOSIS — O30032 Twin pregnancy, monochorionic/diamniotic, second trimester: Secondary | ICD-10-CM

## 2015-12-04 DIAGNOSIS — Z3689 Encounter for other specified antenatal screening: Secondary | ICD-10-CM

## 2015-12-04 DIAGNOSIS — Z3A18 18 weeks gestation of pregnancy: Secondary | ICD-10-CM

## 2015-12-04 DIAGNOSIS — Z8279 Family history of other congenital malformations, deformations and chromosomal abnormalities: Secondary | ICD-10-CM | POA: Insufficient documentation

## 2015-12-04 DIAGNOSIS — O30039 Twin pregnancy, monochorionic/diamniotic, unspecified trimester: Secondary | ICD-10-CM

## 2015-12-04 DIAGNOSIS — O99282 Endocrine, nutritional and metabolic diseases complicating pregnancy, second trimester: Secondary | ICD-10-CM | POA: Insufficient documentation

## 2015-12-18 ENCOUNTER — Ambulatory Visit (HOSPITAL_COMMUNITY)
Admission: RE | Admit: 2015-12-18 | Discharge: 2015-12-18 | Disposition: A | Discharge status: Home / Self Care | Payer: Medicaid Other | Source: Ambulatory Visit | Attending: Family Medicine | Admitting: Family Medicine

## 2015-12-18 ENCOUNTER — Encounter (HOSPITAL_COMMUNITY): Payer: Self-pay

## 2015-12-18 ENCOUNTER — Other Ambulatory Visit (HOSPITAL_COMMUNITY): Payer: Self-pay | Admitting: Maternal and Fetal Medicine

## 2015-12-18 DIAGNOSIS — O99282 Endocrine, nutritional and metabolic diseases complicating pregnancy, second trimester: Secondary | ICD-10-CM

## 2015-12-18 DIAGNOSIS — O99280 Endocrine, nutritional and metabolic diseases complicating pregnancy, unspecified trimester: Secondary | ICD-10-CM | POA: Diagnosis not present

## 2015-12-18 DIAGNOSIS — O30032 Twin pregnancy, monochorionic/diamniotic, second trimester: Secondary | ICD-10-CM | POA: Insufficient documentation

## 2015-12-18 DIAGNOSIS — Z3A20 20 weeks gestation of pregnancy: Secondary | ICD-10-CM

## 2015-12-18 DIAGNOSIS — Z8279 Family history of other congenital malformations, deformations and chromosomal abnormalities: Secondary | ICD-10-CM

## 2015-12-18 DIAGNOSIS — E039 Hypothyroidism, unspecified: Secondary | ICD-10-CM

## 2015-12-18 DIAGNOSIS — O30039 Twin pregnancy, monochorionic/diamniotic, unspecified trimester: Secondary | ICD-10-CM

## 2015-12-22 ENCOUNTER — Ambulatory Visit (INDEPENDENT_AMBULATORY_CARE_PROVIDER_SITE_OTHER): Payer: Medicaid Other | Admitting: Family Medicine

## 2015-12-22 VITALS — BP 118/58 | HR 70 | Temp 97.9°F | Wt 174.0 lb

## 2015-12-22 DIAGNOSIS — E039 Hypothyroidism, unspecified: Secondary | ICD-10-CM | POA: Diagnosis not present

## 2015-12-22 DIAGNOSIS — O99282 Endocrine, nutritional and metabolic diseases complicating pregnancy, second trimester: Secondary | ICD-10-CM | POA: Diagnosis not present

## 2015-12-22 DIAGNOSIS — O30032 Twin pregnancy, monochorionic/diamniotic, second trimester: Secondary | ICD-10-CM | POA: Diagnosis present

## 2015-12-22 DIAGNOSIS — O0992 Supervision of high risk pregnancy, unspecified, second trimester: Secondary | ICD-10-CM

## 2015-12-22 LAB — POCT URINALYSIS DIP (DEVICE)
BILIRUBIN URINE: NEGATIVE
Glucose, UA: NEGATIVE mg/dL
HGB URINE DIPSTICK: NEGATIVE
KETONES UR: NEGATIVE mg/dL
LEUKOCYTES UA: NEGATIVE
Nitrite: NEGATIVE
Protein, ur: NEGATIVE mg/dL
SPECIFIC GRAVITY, URINE: 1.02 (ref 1.005–1.030)
Urobilinogen, UA: 0.2 mg/dL (ref 0.0–1.0)
pH: 6 (ref 5.0–8.0)

## 2015-12-22 NOTE — Progress Notes (Signed)
Subjective:  Heather Mejia is a 30 y.o. G2P1001 at 2924w5d being seen today for ongoing prenatal care.  She is currently monitored for the following issues for this high-risk pregnancy and has UNSPECIFIED HYPOTHYROIDISM; Anal fissure; Previous child with congenital anomaly, currently pregnant, antepartum; Supervision of high risk pregnancy, antepartum; and Monochorionic diamniotic twin pregnancy in second trimester on her problem list.  Patient reports no complaints.  Contractions: Not present. Vag. Bleeding: None.  Movement: Present. Denies leaking of fluid.   The following portions of the patient's history were reviewed and updated as appropriate: allergies, current medications, past family history, past medical history, past social history, past surgical history and problem list. Problem list updated.  Objective:   Filed Vitals:   12/22/15 0835  BP: 118/58  Pulse: 70  Temp: 97.9 F (36.6 C)  Weight: 174 lb (78.926 kg)    Fetal Status: Fetal Heart Rate (bpm): 145/138 Fundal Height: 25 cm Movement: Present     General:  Alert, oriented and cooperative. Patient is in no acute distress.  Skin: Skin is warm and dry. No rash noted.   Cardiovascular: Normal heart rate noted  Respiratory: Normal respiratory effort, no problems with respiration noted  Abdomen: Soft, gravid, appropriate for gestational age. Pain/Pressure: Absent     Pelvic: Vag. Bleeding: None     Cervical exam deferred        Extremities: Normal range of motion.  Edema: None  Mental Status: Normal mood and affect. Normal behavior. Normal judgment and thought content.   Urinalysis: Urine Protein: Negative Urine Glucose: Negative  Assessment and Plan:  Pregnancy: G2P1001 at 7124w5d  1. Monochorionic diamniotic twin pregnancy in second trimester q 2 wks u/s for TTS check--last on 12/29 WNL, growth again in 2 wks  2. Supervision of high risk pregnancy, antepartum, second trimester Continue prenatal care. -  Alpha fetoprotein, maternal  Preterm labor symptoms and general obstetric precautions including but not limited to vaginal bleeding, contractions, leaking of fluid and fetal movement were reviewed in detail with the patient. Please refer to After Visit Summary for other counseling recommendations.  Return in 3 weeks (on 01/12/2016).   Reva Boresanya S Jozlyn Schatz, MD

## 2015-12-22 NOTE — Patient Instructions (Signed)
Segundo trimestre de Media planner (Second Trimester of Pregnancy) El segundo trimestre va desde la semana13 hasta la 48, desde el cuarto hasta el sexto mes, y suele ser el momento en el que mejor se siente. Su organismo se ha adaptado a Public relations account executive y comienza a Print production planner. En general, las nuseas matutinas han disminuido o han desaparecido completamente, puede tener ms energa y un aumento de apetito. El segundo trimestre es tambin la poca en la que el feto se desarrolla rpidamente. Hacia el final del sexto mes, el feto mide aproximadamente 9pulgadas (23cm) y pesa alrededor de 1 libras (700g). Es probable que sienta que el beb se Software engineer (da pataditas) entre las 108 y 20semanas del Media planner. CAMBIOS EN EL ORGANISMO Su organismo atraviesa por muchos cambios durante el [place], y estos varan de Ardelia Mems mujer a Theatre manager.   Seguir [employer]. Notar que la parte baja del abdomen sobresale.  Podrn aparecer las primeras Apache Corporation caderas, el abdomen y las [place].  Es posible que tenga dolores de cabeza que pueden aliviarse con los medicamentos que el mdico le permita tomar.  Tal vez tenga necesidad de orinar con ms frecuencia porque el feto est ejerciendo presin Field seismologist.  Debido al Glennis Brink podr sentir Victorio Palm estomacal con frecuencia.  Puede estar estreida, ya que ciertas hormonas enlentecen los movimientos de los msculos que JPM[employer] Co desechos a travs de los intestinos.  Pueden aparecer hemorroides o abultarse e hincharse las venas (venas varicosas).  Puede tener dolor de espalda que se debe al Southern Company de peso y a que las hormonas del Scientist, research (life sciences) las articulaciones entre los huesos de la pelvis, y Civil Service fast streamer consecuencia de la modificacin del peso y los msculos que mantienen el equilibrio.  Las Lincoln National Corporation seguirn creciendo y Teaching laboratory technician.  Las Production manager y estar sensibles al cepillado y al hilo dental.  Pueden aparecer zonas oscuras o  manchas (cloasma, mscara del Media planner) en el rostro que probablemente se atenuar despus del nacimiento del beb.  Es posible que se forme una lnea oscura desde el ombligo hasta la zona del pubis (linea nigra) que probablemente se atenuar despus del nacimiento del beb.  Tal vez haya cambios en el cabello que pueden incluir su engrosamiento, crecimiento rpido y cambios en la textura. Adems, a algunas mujeres se les cae el cabello durante o despus del embarazo, o tienen el cabello seco o fino. Lo ms probable es que el cabello se le normalice despus del nacimiento del beb. QU DEBE ESPERAR EN LAS CONSULTAS PRENATALES Durante una visita prenatal de rutina:  La pesarn para asegurarse de que usted y el feto estn creciendo normalmente.  Le tomarn la presin arterial.  Le medirn el abdomen para controlar el desarrollo del beb.  Se escucharn los latidos cardacos fetales.  Se evaluarn los resultados de los estudios solicitados en visitas anteriores. El mdico puede preguntarle lo siguiente:  Cmo se siente.  Si siente los movimientos del beb.  Si ha tenido sntomas anormales, como prdida de lquido, [place], dolores de cabeza intensos o clicos abdominales.  Si est consumiendo algn producto que contenga tabaco, como cigarrillos, tabaco de Higher education careers adviser y Psychologist, sport and exercise.  Si tiene [employer]. Otros estudios que podrn realizarse durante el segundo trimestre incluyen lo siguiente:  Anlisis de sangre para detectar lo siguiente:  Concentraciones de hierro bajas (anemia).  Diabetes gestacional (entre la semana 24 y la 22).  Anticuerpos Rh.  Anlisis de orina para detectar infecciones, diabetes o protenas en la  orina.  Una ecografa para confirmar que el beb crece y se desarrolla correctamente.  Una amniocentesis para diagnosticar posibles problemas genticos.  Estudios del feto para descartar espina bfida y sndrome de Down.  Prueba del VIH (virus  de inmunodeficiencia humana). Los exmenes prenatales de rutina incluyen la prueba de deteccin del VIH, a menos que decida no realizrsela. INSTRUCCIONES PARA EL CUIDADO EN EL HOGAR   Evite fumar, consumir hierbas, beber alcohol y tomar frmacos que no le hayan recetado. Estas sustancias qumicas afectan la formacin y el desarrollo del beb.  No consuma ningn producto que contenga tabaco, lo que incluye cigarrillos, tabaco de mascar y cigarrillos electrnicos. Si necesita ayuda para dejar de fumar, consulte al mdico. Puede recibir asesoramiento y otro tipo de recursos para dejar de fumar.  Siga las indicaciones del mdico en relacin con el uso de medicamentos. Durante el embarazo, hay medicamentos que son seguros de tomar y otros que no.  Haga ejercicio solamente como se lo haya indicado el mdico. Sentir clicos uterinos es un buen signo para detener la actividad fsica.  Contine comiendo alimentos sanos con regularidad.  Use un sostn que le brinde buen soporte si le duelen las mamas.  No se d baos de inmersin en agua caliente, baos turcos ni saunas.  Use el cinturn de seguridad en todo momento mientras conduce.  No coma carne cruda ni queso sin cocinar; evite el contacto con las bandejas sanitarias de los gatos y la tierra que estos animales usan. Estos elementos contienen grmenes que pueden causar defectos congnitos en el beb.  Tome las vitaminas prenatales.  Tome entre 1500 y 2000mg de calcio diariamente comenzando en la semana20 del embarazo hasta el parto.  Si est estreida, pruebe un laxante suave (si el mdico lo autoriza). Consuma ms alimentos ricos en fibra, como vegetales y frutas frescos y cereales integrales. Beba gran cantidad de lquido para mantener la orina de tono claro o color amarillo plido.  Dese baos de asiento con agua tibia para aliviar el dolor o las molestias causadas por las hemorroides. Use una crema para las hemorroides si el mdico la  autoriza.  Si tiene venas varicosas, use medias de descanso. Eleve los pies durante 15minutos, 3 o 4veces por da. Limite el consumo de sal en su dieta.  No levante objetos pesados, use zapatos de tacones bajos y mantenga una buena postura.  Descanse con las piernas elevadas si tiene calambres o dolor de cintura.  Visite a su dentista si an no lo ha hecho durante el embarazo. Use un cepillo de dientes blando para higienizarse los dientes y psese el hilo dental con suavidad.  Puede seguir manteniendo relaciones sexuales, a menos que el mdico le indique lo contrario.  Concurra a todas las visitas prenatales segn las indicaciones de su mdico. SOLICITE ATENCIN MDICA SI:   Tiene mareos.  Siente clicos leves, presin en la pelvis o dolor persistente en el abdomen.  Tiene nuseas, vmitos o diarrea persistentes.  Observa una secrecin vaginal con mal olor.  Siente dolor al orinar. SOLICITE ATENCIN MDICA DE INMEDIATO SI:   Tiene fiebre.  Tiene una prdida de lquido por la vagina.  Tiene sangrado o pequeas prdidas vaginales.  Siente dolor intenso o clicos en el abdomen.  Sube o baja de peso rpidamente.  Tiene dificultad para respirar y siente dolor de pecho.  Sbitamente se le hinchan mucho el rostro, las manos, los tobillos, los pies o las piernas.  No ha sentido los movimientos del beb durante   una hora.  Siente un dolor de cabeza intenso que no se alivia con medicamentos.  Su visin se modifica.   Esta informacin no tiene como fin reemplazar el consejo del mdico. Asegrese de hacerle al mdico cualquier pregunta que tenga.   Document Released: 09/15/2005 Document Revised: 12/27/2014 Elsevier Interactive Patient Education 2016 Elsevier Inc.   Lactancia materna (Breastfeeding) Decidir amamantar es una de las mejores elecciones que puede hacer por usted y su beb. El cambio hormonal durante el embarazo produce el desarrollo del tejido mamario y aumenta  la cantidad y el tamao de los conductos galactforos. Estas hormonas tambin permiten que las protenas, los azcares y las grasas de la sangre produzcan la leche materna en las glndulas productoras de leche. Las hormonas impiden que la leche materna sea liberada antes del nacimiento del beb, adems de impulsar el flujo de leche luego del nacimiento. Una vez que ha comenzado a amamantar, pensar en el beb, as como la succin o el llanto, pueden estimular la liberacin de leche de las glndulas productoras de leche.  LOS BENEFICIOS DE AMAMANTAR Para el beb  La primera leche (calostro) ayuda a mejorar el funcionamiento del sistema digestivo del beb.  La leche tiene anticuerpos que ayudan a prevenir las infecciones en el beb.  El beb tiene una menor incidencia de asma, alergias y del sndrome de muerte sbita del lactante.  Los nutrientes en la leche materna son mejores para el beb que la leche maternizada y estn preparados exclusivamente para cubrir las necesidades del beb.  La leche materna mejora el desarrollo cerebral del beb.  Es menos probable que el beb desarrolle otras enfermedades, como obesidad infantil, asma o diabetes mellitus de tipo 2. Para usted   La lactancia materna favorece el desarrollo de un vnculo muy especial entre la madre y el beb.  Es conveniente. La leche materna siempre est disponible a la temperatura correcta y es econmica.  La lactancia materna ayuda a quemar caloras y a perder el peso ganado durante el embarazo.  Favorece la contraccin del tero al tamao que tena antes del embarazo de manera ms rpida y disminuye el sangrado (loquios) despus del parto.  La lactancia materna contribuye a reducir el riesgo de desarrollar diabetes mellitus de tipo 2, osteoporosis o cncer de mama o de ovario en el futuro. SIGNOS DE QUE EL BEB EST HAMBRIENTO Primeros signos de hambre  Aumenta su estado de alerta o actividad.  Se estira.  Mueve la cabeza  de un lado a otro.  Mueve la cabeza y abre la boca cuando se le toca la mejilla o la comisura de la boca (reflejo de bsqueda).  Aumenta las vocalizaciones, tales como sonidos de succin, se relame los labios, emite arrullos, suspiros, o chirridos.  Mueve la mano hacia la boca.  Se chupa con ganas los dedos o las manos. Signos tardos de hambre  Est agitado.  Llora de manera intermitente. Signos de hambre extrema Los signos de hambre extrema requerirn que lo calme y lo consuele antes de que el beb pueda alimentarse adecuadamente. No espere a que se manifiesten los siguientes signos de hambre extrema para comenzar a amamantar:   Agitacin.  Llanto intenso y fuerte.  Gritos. INFORMACIN BSICA SOBRE LA LACTANCIA MATERNA Iniciacin de la lactancia materna  Encuentre un lugar cmodo para sentarse o acostarse, con un buen respaldo para el cuello y la espalda.  Coloque una almohada o una manta enrollada debajo del beb para acomodarlo a la altura de la mama (si   est sentada). Las almohadas para amamantar se han diseado especialmente a fin de servir de apoyo para los brazos y el beb mientras amamanta.  Asegrese de que el abdomen del beb est frente al suyo.   Masajee suavemente la mama. Con las yemas de los dedos, masajee la pared del pecho hacia el pezn en un movimiento circular. Esto estimula el flujo de leche. Es posible que deba continuar este movimiento mientras amamanta si la leche fluye lentamente.  Sostenga la mama con el pulgar por arriba del pezn y los otros 4 dedos por debajo de la mama. Asegrese de que los dedos se encuentren lejos del pezn y de la boca del beb.  Empuje suavemente los labios del beb con el pezn o con el dedo.  Cuando la boca del beb se abra lo suficiente, acrquelo rpidamente a la mama e introduzca todo el pezn y la zona oscura que lo rodea (areola), tanto como sea posible, dentro de la boca del beb.  Debe haber ms areola visible por  arriba del labio superior del beb que por debajo del labio inferior.  La lengua del beb debe estar entre la enca inferior y la mama.  Asegrese de que la boca del beb est en la posicin correcta alrededor del pezn (prendida). Los labios del beb deben crear un sello sobre la mama y estar doblados hacia afuera (invertidos).  Es comn que el beb succione durante 2 a 3 minutos para que comience el flujo de leche materna. Cmo debe prenderse Es muy importante que le ensee al beb cmo prenderse adecuadamente a la mama. Si el beb no se prende adecuadamente, puede causarle dolor en el pezn y reducir la produccin de leche materna, y hacer que el beb tenga un escaso aumento de peso. Adems, si el beb no se prende adecuadamente al pezn, puede tragar aire durante la alimentacin. Esto puede causarle molestias al beb. Hacer eructar al beb al cambiar de mama puede ayudarlo a liberar el aire. Sin embargo, ensearle al beb cmo prenderse a la mama adecuadamente es la mejor manera de evitar que se sienta molesto por tragar aire mientras se alimenta. Signos de que el beb se ha prendido adecuadamente al pezn:   Tironea o succiona de modo silencioso, sin causarle dolor.  Se escucha que traga cada 3 o 4 succiones.  Hay movimientos musculares por arriba y por delante de sus odos al succionar. Signos de que el beb no se ha prendido adecuadamente al pezn:   Hace ruidos de succin o de chasquido mientras se alimenta.  Siente dolor en el pezn. Si cree que el beb no se prendi correctamente, deslice el dedo en la comisura de la boca y colquelo entre las encas del beb para interrumpir la succin. Intente comenzar a amamantar nuevamente. Signos de lactancia materna exitosa Signos del beb:   Disminuye gradualmente el nmero de succiones o cesa la succin por completo.  Se duerme.  Relaja el cuerpo.  Retiene una pequea cantidad de leche en la boca.  Se desprende solo del  pecho. Signos que presenta usted:  Las mamas han aumentado la firmeza, el peso y el tamao 1 a 3 horas despus de amamantar.  Estn ms blandas inmediatamente despus de amamantar.  Un aumento del volumen de leche, y tambin un cambio en su consistencia y color se producen hacia el quinto da de lactancia materna.  Los pezones no duelen, ni estn agrietados ni sangran. Signos de que su beb recibe la cantidad de leche   Moja al menos 3 paales en 24 horas. La orina debe ser clara y de color amarillo plido a los [address]  [address] de [place]ida.  Defeca al menos 3 veces en 24 horas a los [address]  [address] de [address]. La materia fecal debe ser blanda y Elktonamarillenta.  Defeca al menos 3 veces en 24 horas a los [address] das de [address]. La materia fecal debe ser grumosa y Saginawamarillenta.  No registra una prdida de peso mayor del 10% del peso al nacer durante los primeros 3 [address]  [address] de [place]ida.  Aumenta de peso un promedio de 4 a 7onzas (113 a 198g) por semana despus de los [address]  [address] de vida.  Aumenta de Graziervillepeso, [place] Mariondiariamente, de Dannebrogmanera uniforme a Glass blower/designerpartir de los [address]  [address] de vida, sin Passenger transport managerregistrar prdida de peso despus de las 2semanas de vida. Despus de alimentarse, es posible que el beb regurgite una pequea cantidad. Esto es frecuente. FRECUENCIA Y DURACIN DE LA LACTANCIA MATERNA El amamantamiento frecuente la ayudar a producir ms Azerbaijanleche y a Education officer, communityprevenir problemas de Engineer, miningdolor en los pezones e hinchazn en las [place]villemamas. Alimente al beb cuando muestre signos de hambre o si siente la necesidad de reducir la congestin de las Watongamamas. Esto se denomina "lactancia a demanda". Evite el uso del chupete mientras trabaja para establecer la lactancia (las primeras 4 a 6 semanas despus del nacimiento del beb). Despus de este perodo, podr ofrecerle un chupete. Las investigaciones demostraron que el uso del chupete durante el primer ao de vida del beb disminuye el riesgo de desarrollar el sndrome de muerte sbita del lactante (SMSL). Permita que el nio  se alimente en cada mama todo lo que desee. Contine amamantando al beb hasta que haya terminado de alimentarse. Cuando el beb se desprende o se queda dormido mientras se est alimentando de la primera mama, ofrzcale la segunda. Debido a que, con frecuencia, los recin Sunoconacidos permanecen somnolientos las primeras semanas de vida, es posible que deba despertar al beb para alimentarlo. Los horarios de Acupuncturistlactancia varan de un beb a otro. Sin embargo, las siguientes reglas pueden servir como gua para ayudarla a Lawyergarantizar que el beb se alimenta adecuadamente:  Se puede amamantar a los recin nacidos (bebs de 4 semanas o menos de vida) cada 1 a 3 horas.  No deben transcurrir ms de 3 horas durante el da o 5 horas durante la noche sin que se amamante a los recin nacidos.  Debe amamantar al beb 8 veces como mnimo en un perodo de 24 horas, hasta que comience a introducir slidos en su dieta, a los 6 meses de vida aproximadamente. EXTRACCIN DE Dean Foods CompanyLECHE MATERNA La extraccin y Contractorel almacenamiento de la leche materna le permiten asegurarse de que el beb se alimente exclusivamente de Natural Bridgeleche materna, aun en momentos en los que no puede amamantar. Esto tiene especial importancia si debe regresar al Aleen Campitrabajo en el perodo en que an est amamantando o si no puede estar presente en los momentos en que el beb debe alimentarse. Su asesor en lactancia puede orientarla sobre cunto tiempo es seguro almacenar Corleyleche materna.  El sacaleche es un aparato que le permite extraer leche de la mama a un recipiente estril. Luego, la leche materna extrada puede almacenarse en un refrigerador o Electrical engineercongelador. Algunos sacaleches son Birdie Riddlemanuales, Delaney Meigsmientras que otros son elctricos. Consulte a su asesor en lactancia qu tipo ser ms conveniente para usted. Los sacaleches se pueden comprar; sin embargo, algunos hospitales y grupos de apoyo a la lactancia materna alquilan Sports coachsacaleches mensualmente. Un asesor en  lactancia puede ensearle  cmo extraer W. R. Berkleyleche materna manualmente, en caso de que prefiera no usar un sacaleche.  CMO CUIDAR LAS MAMAS DURANTE LA LACTANCIA MATERNA Los pezones se secan, agrietan y duelen durante la Tour managerlactancia materna. Las siguientes recomendaciones pueden ayudarla a Pharmacologistmantener las TEPPCO Partnersmamas humectadas y sanas:  Careers information officervite usar jabn en los pezones.  Use un sostn de soporte. Aunque no son esenciales, las camisetas sin mangas o los sostenes especiales para Museum/gallery exhibitions officeramamantar estn diseados para acceder fcilmente a las mamas, para Museum/gallery exhibitions officeramamantar sin tener que quitarse todo el sostn o la camiseta. Evite usar sostenes con aro o sostenes muy ajustados.  Seque al aire sus pezones durante 3 a 4minutos despus de amamantar al beb.  Utilice solo apsitos de Haematologistalgodn en el sostn para Environmental health practitionerabsorber las prdidas de Lake Isabellaleche. La prdida de un poco de Public Service Enterprise Groupleche materna entre las tomas es normal.  Utilice lanolina sobre los pezones luego de Museum/gallery exhibitions officeramamantar. La lanolina ayuda a mantener la humedad normal de la piel. Si Botswanausa lanolina pura, no tiene que lavarse los pezones antes de volver a Corporate treasureralimentar al beb. La lanolina pura no es txica para el beb. Adems, puede extraer Beazer Homesmanualmente algunas gotas de Parlierleche materna y Engineer, maintenance (IT)masajear suavemente esa Winn-Dixieleche sobre los pezones, para que la Pukwanaleche se seque al aire. Durante las primeras semanas despus de dar a luz, algunas mujeres pueden experimentar hinchazn en las mamas (congestin Great Meadowsmamaria). La congestin puede hacer que sienta las mamas pesadas, calientes y sensibles al tacto. El pico de la congestin ocurre dentro de los 3 a 5 das despus del Swinkparto. Las siguientes recomendaciones pueden ayudarla a Paramedicaliviar la congestin:  Vace por completo las mamas al QUALCOMMamamantar o Environmental health practitionerextraer leche. Puede aplicar calor hmedo en las mamas (en la ducha o con toallas hmedas para manos) antes de Museum/gallery exhibitions officeramamantar o extraer WPS Resourcesleche. Esto aumenta la circulacin y Saint Vincent and the Grenadinesayuda a que la Providenceleche fluya. Si el beb no vaca por completo las [address] D[address]amas cuando lo [address] Aveamamanta,  extraiga la Bennettleche restante despus de que haya finalizado.  Use un sostn ajustado (para amamantar o comn) o una camiseta sin mangas durante 1 o 2 das para indicar al cuerpo que disminuya ligeramente la produccin de Level Park-Oak Parkleche.  Aplique compresas de hielo Yahoo! Incsobre las mamas, a menos que le resulte demasiado incmodo.  Asegrese de que el beb est prendido y se encuentre en la posicin correcta mientras lo alimenta. Si la congestin persiste luego de 48 horas o despus de seguir estas recomendaciones, comunquese con su mdico o un Holiday representativeasesor en lactancia. RECOMENDACIONES GENERALES PARA EL CUIDADO DE LA SALUD DURANTE LA LACTANCIA MATERNA  Consuma alimentos saludables. Alterne comidas y colaciones, y coma 3 de cada una por da. Dado que lo que come Danaher Corporationafecta la leche materna, es posible que algunas comidas hagan que su beb se vuelva ms irritable de lo habitual. Evite comer este tipo de alimentos si percibe que afectan de manera negativa al beb.  Beba leche, jugos de fruta y agua para Patent examinersatisfacer su sed (aproximadamente 10 vasos al Futures traderda).  Descanse con frecuencia, reljese y tome sus vitaminas prenatales para evitar la fatiga, el estrs y la anemia.  Contine con los autocontroles de la mama.  Evite Product managermasticar y fumar tabaco. Las sustancias qumicas de los cigarrillos que pasan a la leche materna y la exposicin al humo ambiental del tabaco pueden daar al beb.  No consuma alcohol ni drogas, incluida la marihuana. Algunos medicamentos, que pueden ser perjudiciales para el beb, pueden pasar a travs de la Colgate Palmoliveleche materna. Es importante  que consulte a su mdico antes de Medical sales representativetomar cualquier medicamento, incluidos todos los medicamentos recetados y de Milanventa libre, as como los suplementos vitamnicos y herbales. Puede quedar embarazada durante la lactancia. Si desea controlar la natalidad, consulte a su mdico cules son las opciones ms seguras para el beb. SOLICITE ATENCIN MDICA SI:   Usted siente que quiere  dejar de Museum/gallery exhibitions officeramamantar o se siente frustrada con la lactancia.  Siente dolor en las mamas o en los pezones.  Sus pezones estn agrietados o Water quality scientistsangran.  Sus pechos estn irritados, sensibles o calientes.  Tiene un rea hinchada en cualquiera de las mamas.  Siente escalofros o fiebre.  Tiene nuseas o vmitos.  Presenta una secrecin de otro lquido distinto de la leche materna de los pezones.  Sus mamas no se llenan antes de Museum/gallery exhibitions officeramamantar al beb para el quinto da despus del Geneseoparto.  Se siente triste y deprimida.  El beb est demasiado somnoliento como para comer bien.  El beb tiene problemas para dormir.  Moja menos de 3 paales en 24 horas.  Defeca menos de 3 veces en 24 horas.  La piel del beb o la parte blanca de los ojos se vuelven amarillentas.  El beb no ha aumentado de Kerrpeso a los [address] das de [place]ida. SOLICITE ATENCIN MDICA DE INMEDIATO SI:   El beb est muy cansado Retail buyer(letargo) y no se quiere despertar para comer.  Le sube la fiebre sin causa.   Esta informacin no tiene Theme park managercomo fin reemplazar el consejo del mdico. Asegrese de hacerle al mdico cualquier pregunta que tenga.   Document Released: 12/06/2005 Document Revised: 08/27/2015 Elsevier Interactive Patient Education Yahoo[employer]2016 Elsevier Inc.

## 2015-12-22 NOTE — Progress Notes (Signed)
Reviewed tip of week with patient  

## 2015-12-25 LAB — ALPHA FETOPROTEIN, MATERNAL
AFP: 166.6 ng/mL
CURR GEST AGE: 20.5 wks.days
MoM for AFP: 2.96
OPEN SPINA BIFIDA: NEGATIVE

## 2015-12-31 NOTE — Progress Notes (Signed)
12/31/15 Medicaid home form completed.

## 2016-01-01 ENCOUNTER — Other Ambulatory Visit (HOSPITAL_COMMUNITY): Payer: Self-pay | Admitting: Maternal and Fetal Medicine

## 2016-01-01 ENCOUNTER — Ambulatory Visit (HOSPITAL_COMMUNITY)
Admission: RE | Admit: 2016-01-01 | Discharge: 2016-01-01 | Disposition: A | Discharge status: Home / Self Care | Payer: Medicaid Other | Source: Ambulatory Visit | Attending: Family Medicine | Admitting: Family Medicine

## 2016-01-01 DIAGNOSIS — O99282 Endocrine, nutritional and metabolic diseases complicating pregnancy, second trimester: Secondary | ICD-10-CM | POA: Diagnosis not present

## 2016-01-01 DIAGNOSIS — O30039 Twin pregnancy, monochorionic/diamniotic, unspecified trimester: Secondary | ICD-10-CM

## 2016-01-01 DIAGNOSIS — E039 Hypothyroidism, unspecified: Secondary | ICD-10-CM

## 2016-01-01 DIAGNOSIS — O30032 Twin pregnancy, monochorionic/diamniotic, second trimester: Secondary | ICD-10-CM | POA: Insufficient documentation

## 2016-01-01 DIAGNOSIS — Z3A22 22 weeks gestation of pregnancy: Secondary | ICD-10-CM

## 2016-01-01 DIAGNOSIS — O99280 Endocrine, nutritional and metabolic diseases complicating pregnancy, unspecified trimester: Secondary | ICD-10-CM

## 2016-01-01 DIAGNOSIS — Z8279 Family history of other congenital malformations, deformations and chromosomal abnormalities: Secondary | ICD-10-CM

## 2016-01-12 ENCOUNTER — Ambulatory Visit (INDEPENDENT_AMBULATORY_CARE_PROVIDER_SITE_OTHER): Payer: Medicaid Other | Admitting: Family Medicine

## 2016-01-12 VITALS — BP 118/54 | HR 71 | Temp 97.8°F | Wt 176.6 lb

## 2016-01-12 DIAGNOSIS — O30032 Twin pregnancy, monochorionic/diamniotic, second trimester: Secondary | ICD-10-CM

## 2016-01-12 DIAGNOSIS — O0992 Supervision of high risk pregnancy, unspecified, second trimester: Secondary | ICD-10-CM

## 2016-01-12 LAB — POCT URINALYSIS DIP (DEVICE)
Bilirubin Urine: NEGATIVE
GLUCOSE, UA: NEGATIVE mg/dL
Ketones, ur: NEGATIVE mg/dL
Nitrite: NEGATIVE
PROTEIN: 30 mg/dL — AB
SPECIFIC GRAVITY, URINE: 1.025 (ref 1.005–1.030)
UROBILINOGEN UA: 0.2 mg/dL (ref 0.0–1.0)
pH: 6.5 (ref 5.0–8.0)

## 2016-01-12 MED ORDER — PRENATAL VITAMINS 0.8 MG PO TABS: 1.0000 | ORAL_TABLET | Freq: Every day | ORAL | Status: DC

## 2016-01-12 NOTE — Patient Instructions (Signed)
Segundo trimestre de Media planner (Second Trimester of Pregnancy) El segundo trimestre va desde la semana13 hasta la 48, desde el cuarto hasta el sexto mes, y suele ser el momento en el que mejor se siente. Su organismo se ha adaptado a Public relations account executive y comienza a Print production planner. En general, las nuseas matutinas han disminuido o han desaparecido completamente, puede tener ms energa y un aumento de apetito. El segundo trimestre es tambin la poca en la que el feto se desarrolla rpidamente. Hacia el final del sexto mes, el feto mide aproximadamente 9pulgadas (23cm) y pesa alrededor de 1 libras (700g). Es probable que sienta que el beb se Software engineer (da pataditas) entre las 108 y 20semanas del Media planner. CAMBIOS EN EL ORGANISMO Su organismo atraviesa por muchos cambios durante el [place], y estos varan de Ardelia Mems mujer a Theatre manager.   Seguir [employer]. Notar que la parte baja del abdomen sobresale.  Podrn aparecer las primeras Apache Corporation caderas, el abdomen y las [place].  Es posible que tenga dolores de cabeza que pueden aliviarse con los medicamentos que el mdico le permita tomar.  Tal vez tenga necesidad de orinar con ms frecuencia porque el feto est ejerciendo presin Field seismologist.  Debido al Glennis Brink podr sentir Victorio Palm estomacal con frecuencia.  Puede estar estreida, ya que ciertas hormonas enlentecen los movimientos de los msculos que JPM[employer] Co desechos a travs de los intestinos.  Pueden aparecer hemorroides o abultarse e hincharse las venas (venas varicosas).  Puede tener dolor de espalda que se debe al Southern Company de peso y a que las hormonas del Scientist, research (life sciences) las articulaciones entre los huesos de la pelvis, y Civil Service fast streamer consecuencia de la modificacin del peso y los msculos que mantienen el equilibrio.  Las Lincoln National Corporation seguirn creciendo y Teaching laboratory technician.  Las Production manager y estar sensibles al cepillado y al hilo dental.  Pueden aparecer zonas oscuras o  manchas (cloasma, mscara del Media planner) en el rostro que probablemente se atenuar despus del nacimiento del beb.  Es posible que se forme una lnea oscura desde el ombligo hasta la zona del pubis (linea nigra) que probablemente se atenuar despus del nacimiento del beb.  Tal vez haya cambios en el cabello que pueden incluir su engrosamiento, crecimiento rpido y cambios en la textura. Adems, a algunas mujeres se les cae el cabello durante o despus del embarazo, o tienen el cabello seco o fino. Lo ms probable es que el cabello se le normalice despus del nacimiento del beb. QU DEBE ESPERAR EN LAS CONSULTAS PRENATALES Durante una visita prenatal de rutina:  La pesarn para asegurarse de que usted y el feto estn creciendo normalmente.  Le tomarn la presin arterial.  Le medirn el abdomen para controlar el desarrollo del beb.  Se escucharn los latidos cardacos fetales.  Se evaluarn los resultados de los estudios solicitados en visitas anteriores. El mdico puede preguntarle lo siguiente:  Cmo se siente.  Si siente los movimientos del beb.  Si ha tenido sntomas anormales, como prdida de lquido, [place], dolores de cabeza intensos o clicos abdominales.  Si est consumiendo algn producto que contenga tabaco, como cigarrillos, tabaco de Higher education careers adviser y Psychologist, sport and exercise.  Si tiene [employer]. Otros estudios que podrn realizarse durante el segundo trimestre incluyen lo siguiente:  Anlisis de sangre para detectar lo siguiente:  Concentraciones de hierro bajas (anemia).  Diabetes gestacional (entre la semana 24 y la 22).  Anticuerpos Rh.  Anlisis de orina para detectar infecciones, diabetes o protenas en la  orina.  Una ecografa para confirmar que el beb crece y se desarrolla correctamente.  Una amniocentesis para diagnosticar posibles problemas genticos.  Estudios del feto para descartar espina bfida y sndrome de Down.  Prueba del VIH (virus  de inmunodeficiencia humana). Los exmenes prenatales de rutina incluyen la prueba de deteccin del VIH, a menos que decida no realizrsela. INSTRUCCIONES PARA EL CUIDADO EN EL HOGAR   Evite fumar, consumir hierbas, beber alcohol y tomar frmacos que no le hayan recetado. Estas sustancias qumicas afectan la formacin y el desarrollo del beb.  No consuma ningn producto que contenga tabaco, lo que incluye cigarrillos, tabaco de mascar y cigarrillos electrnicos. Si necesita ayuda para dejar de fumar, consulte al mdico. Puede recibir asesoramiento y otro tipo de recursos para dejar de fumar.  Siga las indicaciones del mdico en relacin con el uso de medicamentos. Durante el embarazo, hay medicamentos que son seguros de tomar y otros que no.  Haga ejercicio solamente como se lo haya indicado el mdico. Sentir clicos uterinos es un buen signo para detener la actividad fsica.  Contine comiendo alimentos sanos con regularidad.  Use un sostn que le brinde buen soporte si le duelen las mamas.  No se d baos de inmersin en agua caliente, baos turcos ni saunas.  Use el cinturn de seguridad en todo momento mientras conduce.  No coma carne cruda ni queso sin cocinar; evite el contacto con las bandejas sanitarias de los gatos y la tierra que estos animales usan. Estos elementos contienen grmenes que pueden causar defectos congnitos en el beb.  Tome las vitaminas prenatales.  Tome entre 1500 y 2000mg de calcio diariamente comenzando en la semana20 del embarazo hasta el parto.  Si est estreida, pruebe un laxante suave (si el mdico lo autoriza). Consuma ms alimentos ricos en fibra, como vegetales y frutas frescos y cereales integrales. Beba gran cantidad de lquido para mantener la orina de tono claro o color amarillo plido.  Dese baos de asiento con agua tibia para aliviar el dolor o las molestias causadas por las hemorroides. Use una crema para las hemorroides si el mdico la  autoriza.  Si tiene venas varicosas, use medias de descanso. Eleve los pies durante 15minutos, 3 o 4veces por da. Limite el consumo de sal en su dieta.  No levante objetos pesados, use zapatos de tacones bajos y mantenga una buena postura.  Descanse con las piernas elevadas si tiene calambres o dolor de cintura.  Visite a su dentista si an no lo ha hecho durante el embarazo. Use un cepillo de dientes blando para higienizarse los dientes y psese el hilo dental con suavidad.  Puede seguir manteniendo relaciones sexuales, a menos que el mdico le indique lo contrario.  Concurra a todas las visitas prenatales segn las indicaciones de su mdico. SOLICITE ATENCIN MDICA SI:   Tiene mareos.  Siente clicos leves, presin en la pelvis o dolor persistente en el abdomen.  Tiene nuseas, vmitos o diarrea persistentes.  Observa una secrecin vaginal con mal olor.  Siente dolor al orinar. SOLICITE ATENCIN MDICA DE INMEDIATO SI:   Tiene fiebre.  Tiene una prdida de lquido por la vagina.  Tiene sangrado o pequeas prdidas vaginales.  Siente dolor intenso o clicos en el abdomen.  Sube o baja de peso rpidamente.  Tiene dificultad para respirar y siente dolor de pecho.  Sbitamente se le hinchan mucho el rostro, las manos, los tobillos, los pies o las piernas.  No ha sentido los movimientos del beb durante   una hora.  Siente un dolor de cabeza intenso que no se alivia con medicamentos.  Su visin se modifica.   Esta informacin no tiene como fin reemplazar el consejo del mdico. Asegrese de hacerle al mdico cualquier pregunta que tenga.   Document Released: 09/15/2005 Document Revised: 12/27/2014 Elsevier Interactive Patient Education 2016 Elsevier Inc.   Lactancia materna (Breastfeeding) Decidir amamantar es una de las mejores elecciones que puede hacer por usted y su beb. El cambio hormonal durante el embarazo produce el desarrollo del tejido mamario y aumenta  la cantidad y el tamao de los conductos galactforos. Estas hormonas tambin permiten que las protenas, los azcares y las grasas de la sangre produzcan la leche materna en las glndulas productoras de leche. Las hormonas impiden que la leche materna sea liberada antes del nacimiento del beb, adems de impulsar el flujo de leche luego del nacimiento. Una vez que ha comenzado a amamantar, pensar en el beb, as como la succin o el llanto, pueden estimular la liberacin de leche de las glndulas productoras de leche.  LOS BENEFICIOS DE AMAMANTAR Para el beb  La primera leche (calostro) ayuda a mejorar el funcionamiento del sistema digestivo del beb.  La leche tiene anticuerpos que ayudan a prevenir las infecciones en el beb.  El beb tiene una menor incidencia de asma, alergias y del sndrome de muerte sbita del lactante.  Los nutrientes en la leche materna son mejores para el beb que la leche maternizada y estn preparados exclusivamente para cubrir las necesidades del beb.  La leche materna mejora el desarrollo cerebral del beb.  Es menos probable que el beb desarrolle otras enfermedades, como obesidad infantil, asma o diabetes mellitus de tipo 2. Para usted   La lactancia materna favorece el desarrollo de un vnculo muy especial entre la madre y el beb.  Es conveniente. La leche materna siempre est disponible a la temperatura correcta y es econmica.  La lactancia materna ayuda a quemar caloras y a perder el peso ganado durante el embarazo.  Favorece la contraccin del tero al tamao que tena antes del embarazo de manera ms rpida y disminuye el sangrado (loquios) despus del parto.  La lactancia materna contribuye a reducir el riesgo de desarrollar diabetes mellitus de tipo 2, osteoporosis o cncer de mama o de ovario en el futuro. SIGNOS DE QUE EL BEB EST HAMBRIENTO Primeros signos de hambre  Aumenta su estado de alerta o actividad.  Se estira.  Mueve la cabeza  de un lado a otro.  Mueve la cabeza y abre la boca cuando se le toca la mejilla o la comisura de la boca (reflejo de bsqueda).  Aumenta las vocalizaciones, tales como sonidos de succin, se relame los labios, emite arrullos, suspiros, o chirridos.  Mueve la mano hacia la boca.  Se chupa con ganas los dedos o las manos. Signos tardos de hambre  Est agitado.  Llora de manera intermitente. Signos de hambre extrema Los signos de hambre extrema requerirn que lo calme y lo consuele antes de que el beb pueda alimentarse adecuadamente. No espere a que se manifiesten los siguientes signos de hambre extrema para comenzar a amamantar:   Agitacin.  Llanto intenso y fuerte.  Gritos. INFORMACIN BSICA SOBRE LA LACTANCIA MATERNA Iniciacin de la lactancia materna  Encuentre un lugar cmodo para sentarse o acostarse, con un buen respaldo para el cuello y la espalda.  Coloque una almohada o una manta enrollada debajo del beb para acomodarlo a la altura de la mama (si   est sentada). Las almohadas para amamantar se han diseado especialmente a fin de servir de apoyo para los brazos y el beb mientras amamanta.  Asegrese de que el abdomen del beb est frente al suyo.   Masajee suavemente la mama. Con las yemas de los dedos, masajee la pared del pecho hacia el pezn en un movimiento circular. Esto estimula el flujo de leche. Es posible que deba continuar este movimiento mientras amamanta si la leche fluye lentamente.  Sostenga la mama con el pulgar por arriba del pezn y los otros 4 dedos por debajo de la mama. Asegrese de que los dedos se encuentren lejos del pezn y de la boca del beb.  Empuje suavemente los labios del beb con el pezn o con el dedo.  Cuando la boca del beb se abra lo suficiente, acrquelo rpidamente a la mama e introduzca todo el pezn y la zona oscura que lo rodea (areola), tanto como sea posible, dentro de la boca del beb.  Debe haber ms areola visible por  arriba del labio superior del beb que por debajo del labio inferior.  La lengua del beb debe estar entre la enca inferior y la mama.  Asegrese de que la boca del beb est en la posicin correcta alrededor del pezn (prendida). Los labios del beb deben crear un sello sobre la mama y estar doblados hacia afuera (invertidos).  Es comn que el beb succione durante 2 a 3 minutos para que comience el flujo de leche materna. Cmo debe prenderse Es muy importante que le ensee al beb cmo prenderse adecuadamente a la mama. Si el beb no se prende adecuadamente, puede causarle dolor en el pezn y reducir la produccin de leche materna, y hacer que el beb tenga un escaso aumento de peso. Adems, si el beb no se prende adecuadamente al pezn, puede tragar aire durante la alimentacin. Esto puede causarle molestias al beb. Hacer eructar al beb al cambiar de mama puede ayudarlo a liberar el aire. Sin embargo, ensearle al beb cmo prenderse a la mama adecuadamente es la mejor manera de evitar que se sienta molesto por tragar aire mientras se alimenta. Signos de que el beb se ha prendido adecuadamente al pezn:   Tironea o succiona de modo silencioso, sin causarle dolor.  Se escucha que traga cada 3 o 4 succiones.  Hay movimientos musculares por arriba y por delante de sus odos al succionar. Signos de que el beb no se ha prendido adecuadamente al pezn:   Hace ruidos de succin o de chasquido mientras se alimenta.  Siente dolor en el pezn. Si cree que el beb no se prendi correctamente, deslice el dedo en la comisura de la boca y colquelo entre las encas del beb para interrumpir la succin. Intente comenzar a amamantar nuevamente. Signos de lactancia materna exitosa Signos del beb:   Disminuye gradualmente el nmero de succiones o cesa la succin por completo.  Se duerme.  Relaja el cuerpo.  Retiene una pequea cantidad de leche en la boca.  Se desprende solo del  pecho. Signos que presenta usted:  Las mamas han aumentado la firmeza, el peso y el tamao 1 a 3 horas despus de amamantar.  Estn ms blandas inmediatamente despus de amamantar.  Un aumento del volumen de leche, y tambin un cambio en su consistencia y color se producen hacia el quinto da de lactancia materna.  Los pezones no duelen, ni estn agrietados ni sangran. Signos de que su beb recibe la cantidad de leche   Moja al menos 3 paales en 24 horas. La orina debe ser clara y de color amarillo plido a los [address]  [address] de [place]ida.  Defeca al menos 3 veces en 24 horas a los [address]  [address] de [address]. La materia fecal debe ser blanda y Elktonamarillenta.  Defeca al menos 3 veces en 24 horas a los [address] das de [address]. La materia fecal debe ser grumosa y Saginawamarillenta.  No registra una prdida de peso mayor del 10% del peso al nacer durante los primeros 3 [address]  [address] de [place]ida.  Aumenta de peso un promedio de 4 a 7onzas (113 a 198g) por semana despus de los [address]  [address] de vida.  Aumenta de Graziervillepeso, [place] Mariondiariamente, de Dannebrogmanera uniforme a Glass blower/designerpartir de los [address]  [address] de vida, sin Passenger transport managerregistrar prdida de peso despus de las 2semanas de vida. Despus de alimentarse, es posible que el beb regurgite una pequea cantidad. Esto es frecuente. FRECUENCIA Y DURACIN DE LA LACTANCIA MATERNA El amamantamiento frecuente la ayudar a producir ms Azerbaijanleche y a Education officer, communityprevenir problemas de Engineer, miningdolor en los pezones e hinchazn en las [place]villemamas. Alimente al beb cuando muestre signos de hambre o si siente la necesidad de reducir la congestin de las Watongamamas. Esto se denomina "lactancia a demanda". Evite el uso del chupete mientras trabaja para establecer la lactancia (las primeras 4 a 6 semanas despus del nacimiento del beb). Despus de este perodo, podr ofrecerle un chupete. Las investigaciones demostraron que el uso del chupete durante el primer ao de vida del beb disminuye el riesgo de desarrollar el sndrome de muerte sbita del lactante (SMSL). Permita que el nio  se alimente en cada mama todo lo que desee. Contine amamantando al beb hasta que haya terminado de alimentarse. Cuando el beb se desprende o se queda dormido mientras se est alimentando de la primera mama, ofrzcale la segunda. Debido a que, con frecuencia, los recin Sunoconacidos permanecen somnolientos las primeras semanas de vida, es posible que deba despertar al beb para alimentarlo. Los horarios de Acupuncturistlactancia varan de un beb a otro. Sin embargo, las siguientes reglas pueden servir como gua para ayudarla a Lawyergarantizar que el beb se alimenta adecuadamente:  Se puede amamantar a los recin nacidos (bebs de 4 semanas o menos de vida) cada 1 a 3 horas.  No deben transcurrir ms de 3 horas durante el da o 5 horas durante la noche sin que se amamante a los recin nacidos.  Debe amamantar al beb 8 veces como mnimo en un perodo de 24 horas, hasta que comience a introducir slidos en su dieta, a los 6 meses de vida aproximadamente. EXTRACCIN DE Dean Foods CompanyLECHE MATERNA La extraccin y Contractorel almacenamiento de la leche materna le permiten asegurarse de que el beb se alimente exclusivamente de Natural Bridgeleche materna, aun en momentos en los que no puede amamantar. Esto tiene especial importancia si debe regresar al Aleen Campitrabajo en el perodo en que an est amamantando o si no puede estar presente en los momentos en que el beb debe alimentarse. Su asesor en lactancia puede orientarla sobre cunto tiempo es seguro almacenar Corleyleche materna.  El sacaleche es un aparato que le permite extraer leche de la mama a un recipiente estril. Luego, la leche materna extrada puede almacenarse en un refrigerador o Electrical engineercongelador. Algunos sacaleches son Birdie Riddlemanuales, Delaney Meigsmientras que otros son elctricos. Consulte a su asesor en lactancia qu tipo ser ms conveniente para usted. Los sacaleches se pueden comprar; sin embargo, algunos hospitales y grupos de apoyo a la lactancia materna alquilan Sports coachsacaleches mensualmente. Un asesor en  lactancia puede ensearle  cmo extraer W. R. Berkleyleche materna manualmente, en caso de que prefiera no usar un sacaleche.  CMO CUIDAR LAS MAMAS DURANTE LA LACTANCIA MATERNA Los pezones se secan, agrietan y duelen durante la Tour managerlactancia materna. Las siguientes recomendaciones pueden ayudarla a Pharmacologistmantener las TEPPCO Partnersmamas humectadas y sanas:  Careers information officervite usar jabn en los pezones.  Use un sostn de soporte. Aunque no son esenciales, las camisetas sin mangas o los sostenes especiales para Museum/gallery exhibitions officeramamantar estn diseados para acceder fcilmente a las mamas, para Museum/gallery exhibitions officeramamantar sin tener que quitarse todo el sostn o la camiseta. Evite usar sostenes con aro o sostenes muy ajustados.  Seque al aire sus pezones durante 3 a 4minutos despus de amamantar al beb.  Utilice solo apsitos de Haematologistalgodn en el sostn para Environmental health practitionerabsorber las prdidas de Lake Isabellaleche. La prdida de un poco de Public Service Enterprise Groupleche materna entre las tomas es normal.  Utilice lanolina sobre los pezones luego de Museum/gallery exhibitions officeramamantar. La lanolina ayuda a mantener la humedad normal de la piel. Si Botswanausa lanolina pura, no tiene que lavarse los pezones antes de volver a Corporate treasureralimentar al beb. La lanolina pura no es txica para el beb. Adems, puede extraer Beazer Homesmanualmente algunas gotas de Parlierleche materna y Engineer, maintenance (IT)masajear suavemente esa Winn-Dixieleche sobre los pezones, para que la Pukwanaleche se seque al aire. Durante las primeras semanas despus de dar a luz, algunas mujeres pueden experimentar hinchazn en las mamas (congestin Great Meadowsmamaria). La congestin puede hacer que sienta las mamas pesadas, calientes y sensibles al tacto. El pico de la congestin ocurre dentro de los 3 a 5 das despus del Swinkparto. Las siguientes recomendaciones pueden ayudarla a Paramedicaliviar la congestin:  Vace por completo las mamas al QUALCOMMamamantar o Environmental health practitionerextraer leche. Puede aplicar calor hmedo en las mamas (en la ducha o con toallas hmedas para manos) antes de Museum/gallery exhibitions officeramamantar o extraer WPS Resourcesleche. Esto aumenta la circulacin y Saint Vincent and the Grenadinesayuda a que la Providenceleche fluya. Si el beb no vaca por completo las [address] D[address]amas cuando lo [address] Aveamamanta,  extraiga la Bennettleche restante despus de que haya finalizado.  Use un sostn ajustado (para amamantar o comn) o una camiseta sin mangas durante 1 o 2 das para indicar al cuerpo que disminuya ligeramente la produccin de Level Park-Oak Parkleche.  Aplique compresas de hielo Yahoo! Incsobre las mamas, a menos que le resulte demasiado incmodo.  Asegrese de que el beb est prendido y se encuentre en la posicin correcta mientras lo alimenta. Si la congestin persiste luego de 48 horas o despus de seguir estas recomendaciones, comunquese con su mdico o un Holiday representativeasesor en lactancia. RECOMENDACIONES GENERALES PARA EL CUIDADO DE LA SALUD DURANTE LA LACTANCIA MATERNA  Consuma alimentos saludables. Alterne comidas y colaciones, y coma 3 de cada una por da. Dado que lo que come Danaher Corporationafecta la leche materna, es posible que algunas comidas hagan que su beb se vuelva ms irritable de lo habitual. Evite comer este tipo de alimentos si percibe que afectan de manera negativa al beb.  Beba leche, jugos de fruta y agua para Patent examinersatisfacer su sed (aproximadamente 10 vasos al Futures traderda).  Descanse con frecuencia, reljese y tome sus vitaminas prenatales para evitar la fatiga, el estrs y la anemia.  Contine con los autocontroles de la mama.  Evite Product managermasticar y fumar tabaco. Las sustancias qumicas de los cigarrillos que pasan a la leche materna y la exposicin al humo ambiental del tabaco pueden daar al beb.  No consuma alcohol ni drogas, incluida la marihuana. Algunos medicamentos, que pueden ser perjudiciales para el beb, pueden pasar a travs de la Colgate Palmoliveleche materna. Es importante  que consulte a su mdico antes de Medical sales representativetomar cualquier medicamento, incluidos todos los medicamentos recetados y de Milanventa libre, as como los suplementos vitamnicos y herbales. Puede quedar embarazada durante la lactancia. Si desea controlar la natalidad, consulte a su mdico cules son las opciones ms seguras para el beb. SOLICITE ATENCIN MDICA SI:   Usted siente que quiere  dejar de Museum/gallery exhibitions officeramamantar o se siente frustrada con la lactancia.  Siente dolor en las mamas o en los pezones.  Sus pezones estn agrietados o Water quality scientistsangran.  Sus pechos estn irritados, sensibles o calientes.  Tiene un rea hinchada en cualquiera de las mamas.  Siente escalofros o fiebre.  Tiene nuseas o vmitos.  Presenta una secrecin de otro lquido distinto de la leche materna de los pezones.  Sus mamas no se llenan antes de Museum/gallery exhibitions officeramamantar al beb para el quinto da despus del Geneseoparto.  Se siente triste y deprimida.  El beb est demasiado somnoliento como para comer bien.  El beb tiene problemas para dormir.  Moja menos de 3 paales en 24 horas.  Defeca menos de 3 veces en 24 horas.  La piel del beb o la parte blanca de los ojos se vuelven amarillentas.  El beb no ha aumentado de Kerrpeso a los [address] das de [place]ida. SOLICITE ATENCIN MDICA DE INMEDIATO SI:   El beb est muy cansado Retail buyer(letargo) y no se quiere despertar para comer.  Le sube la fiebre sin causa.   Esta informacin no tiene Theme park managercomo fin reemplazar el consejo del mdico. Asegrese de hacerle al mdico cualquier pregunta que tenga.   Document Released: 12/06/2005 Document Revised: 08/27/2015 Elsevier Interactive Patient Education Yahoo[employer]2016 Elsevier Inc.

## 2016-01-12 NOTE — Addendum Note (Signed)
Addended by: Geanie Berlin on: 01/12/2016 08:47 AM   Modules accepted: Orders, Medications

## 2016-01-12 NOTE — Progress Notes (Signed)
Urine: trace hgb, wbcs Breastfeeding tip of the week reviewed

## 2016-01-12 NOTE — Progress Notes (Signed)
Subjective:  Heather Mejia is a 30 y.o. G2P1001 at [redacted]w[redacted]d being seen today for ongoing prenatal care.  She is currently monitored for the following issues for this high-risk pregnancy and has UNSPECIFIED HYPOTHYROIDISM; Anal fissure; Previous child with congenital anomaly, currently pregnant, antepartum; Supervision of high risk pregnancy, antepartum; and Monochorionic diamniotic twin pregnancy in second trimester on her problem list.  Patient reports no complaints.  Contractions: Not present. Vag. Bleeding: None.  Movement: Present. Denies leaking of fluid.   The following portions of the patient's history were reviewed and updated as appropriate: allergies, current medications, past family history, past medical history, past social history, past surgical history and problem list. Problem list updated.  Objective:   Filed Vitals:   01/12/16 0801  BP: 118/54  Pulse: 71  Temp: 97.8 F (36.6 C)  Weight: 176 lb 9.6 oz (80.105 kg)    Fetal Status: Fetal Heart Rate (bpm): 148/154   Movement: Present     General:  Alert, oriented and cooperative. Patient is in no acute distress.  Skin: Skin is warm and dry. No rash noted.   Cardiovascular: Normal heart rate noted  Respiratory: Normal respiratory effort, no problems with respiration noted  Abdomen: Soft, gravid, appropriate for gestational age. Pain/Pressure: Present     Pelvic: Vag. Bleeding: None     Cervical exam deferred        Extremities: Normal range of motion.  Edema: None  Mental Status: Normal mood and affect. Normal behavior. Normal judgment and thought content.   Urinalysis: Urine Protein: 1+ Urine Glucose: Negative  Assessment and Plan:  Pregnancy: G2P1001 at [redacted]w[redacted]d  1. Monochorionic diamniotic twin pregnancy in second trimester - Currently growing well with no TTTS - has scheduled follow up. Q2wk screening for TTTS and q4 week growth  2. Supervision of high risk pregnancy, antepartum, second trimester -  updated box - TSH and 28 week labs next visit  Preterm labor symptoms and general obstetric precautions including but not limited to vaginal bleeding, contractions, leaking of fluid and fetal movement were reviewed in detail with the patient. Please refer to After Visit Summary for other counseling recommendations.  Return in 4 weeks (on 02/09/2016).   Federico Flake, MD

## 2016-01-13 ENCOUNTER — Encounter: Payer: Self-pay | Admitting: *Deleted

## 2016-01-13 DIAGNOSIS — O0990 Supervision of high risk pregnancy, unspecified, unspecified trimester: Secondary | ICD-10-CM

## 2016-01-20 ENCOUNTER — Encounter (HOSPITAL_COMMUNITY): Payer: Self-pay

## 2016-01-20 ENCOUNTER — Ambulatory Visit (HOSPITAL_COMMUNITY)
Admission: RE | Admit: 2016-01-20 | Discharge: 2016-01-20 | Disposition: A | Discharge status: Home / Self Care | Payer: Medicaid Other | Source: Ambulatory Visit | Attending: Advanced Practice Midwife | Admitting: Advanced Practice Midwife

## 2016-01-20 ENCOUNTER — Other Ambulatory Visit (HOSPITAL_COMMUNITY): Payer: Self-pay | Admitting: Maternal and Fetal Medicine

## 2016-01-20 DIAGNOSIS — Z8279 Family history of other congenital malformations, deformations and chromosomal abnormalities: Secondary | ICD-10-CM

## 2016-01-20 DIAGNOSIS — Z3A24 24 weeks gestation of pregnancy: Secondary | ICD-10-CM

## 2016-01-20 DIAGNOSIS — O30032 Twin pregnancy, monochorionic/diamniotic, second trimester: Secondary | ICD-10-CM | POA: Diagnosis present

## 2016-01-20 DIAGNOSIS — O99282 Endocrine, nutritional and metabolic diseases complicating pregnancy, second trimester: Principal | ICD-10-CM

## 2016-01-20 DIAGNOSIS — E039 Hypothyroidism, unspecified: Secondary | ICD-10-CM | POA: Diagnosis not present

## 2016-02-03 ENCOUNTER — Other Ambulatory Visit (HOSPITAL_COMMUNITY): Payer: Self-pay | Admitting: Maternal and Fetal Medicine

## 2016-02-03 ENCOUNTER — Ambulatory Visit (HOSPITAL_COMMUNITY)
Admission: RE | Admit: 2016-02-03 | Discharge: 2016-02-03 | Disposition: A | Discharge status: Home / Self Care | Payer: Medicaid Other | Source: Ambulatory Visit | Attending: Advanced Practice Midwife | Admitting: Advanced Practice Midwife

## 2016-02-03 ENCOUNTER — Other Ambulatory Visit (HOSPITAL_COMMUNITY): Payer: Self-pay | Admitting: *Deleted

## 2016-02-03 DIAGNOSIS — O30032 Twin pregnancy, monochorionic/diamniotic, second trimester: Secondary | ICD-10-CM

## 2016-02-03 DIAGNOSIS — O30039 Twin pregnancy, monochorionic/diamniotic, unspecified trimester: Secondary | ICD-10-CM

## 2016-02-03 DIAGNOSIS — O99282 Endocrine, nutritional and metabolic diseases complicating pregnancy, second trimester: Secondary | ICD-10-CM | POA: Diagnosis not present

## 2016-02-03 DIAGNOSIS — Z8279 Family history of other congenital malformations, deformations and chromosomal abnormalities: Secondary | ICD-10-CM | POA: Insufficient documentation

## 2016-02-03 DIAGNOSIS — E039 Hypothyroidism, unspecified: Secondary | ICD-10-CM | POA: Insufficient documentation

## 2016-02-03 DIAGNOSIS — Z3A26 26 weeks gestation of pregnancy: Secondary | ICD-10-CM | POA: Insufficient documentation

## 2016-02-09 ENCOUNTER — Encounter: Payer: Self-pay | Admitting: Obstetrics and Gynecology

## 2016-02-09 ENCOUNTER — Ambulatory Visit (INDEPENDENT_AMBULATORY_CARE_PROVIDER_SITE_OTHER): Payer: Medicaid Other | Admitting: Obstetrics and Gynecology

## 2016-02-09 VITALS — BP 120/64 | HR 64 | Temp 97.5°F | Wt 183.0 lb

## 2016-02-09 DIAGNOSIS — O352XX0 Maternal care for (suspected) hereditary disease in fetus, not applicable or unspecified: Secondary | ICD-10-CM | POA: Diagnosis not present

## 2016-02-09 DIAGNOSIS — O30032 Twin pregnancy, monochorionic/diamniotic, second trimester: Secondary | ICD-10-CM | POA: Diagnosis not present

## 2016-02-09 DIAGNOSIS — Z23 Encounter for immunization: Secondary | ICD-10-CM

## 2016-02-09 DIAGNOSIS — O99280 Endocrine, nutritional and metabolic diseases complicating pregnancy, unspecified trimester: Secondary | ICD-10-CM

## 2016-02-09 DIAGNOSIS — E039 Hypothyroidism, unspecified: Secondary | ICD-10-CM | POA: Diagnosis not present

## 2016-02-09 DIAGNOSIS — O0993 Supervision of high risk pregnancy, unspecified, third trimester: Secondary | ICD-10-CM

## 2016-02-09 LAB — POCT URINALYSIS DIP (DEVICE)
BILIRUBIN URINE: NEGATIVE
Glucose, UA: NEGATIVE mg/dL
Hgb urine dipstick: NEGATIVE
KETONES UR: NEGATIVE mg/dL
LEUKOCYTES UA: NEGATIVE
NITRITE: NEGATIVE
Protein, ur: 30 mg/dL — AB
Specific Gravity, Urine: >=1.03 (ref 1.005–1.030)
Urobilinogen, UA: 0.2 mg/dL (ref 0.0–1.0)
pH: 6.5 (ref 5.0–8.0)

## 2016-02-09 LAB — OB RESULTS CONSOLE RPR: RPR: NONREACTIVE

## 2016-02-09 LAB — CBC AND DIFFERENTIAL
HEMATOCRIT: 34 % — AB (ref 36–46)
HEMOGLOBIN: 11.6 g/dL — AB (ref 12.0–16.0)
Platelets: 269 10*3/uL (ref 150–399)
WBC: 8.4 10*3/mL

## 2016-02-09 LAB — CBC
HCT: 34.1 % — ABNORMAL LOW (ref 36.0–46.0)
HEMOGLOBIN: 11.6 g/dL — AB (ref 12.0–15.0)
MCH: 31.8 pg (ref 26.0–34.0)
MCHC: 34 g/dL (ref 30.0–36.0)
MCV: 93.4 fL (ref 78.0–100.0)
MPV: 10.1 fL (ref 8.6–12.4)
PLATELETS: 269 10*3/uL (ref 150–400)
RBC: 3.65 MIL/uL — ABNORMAL LOW (ref 3.87–5.11)
RDW: 13.7 % (ref 11.5–15.5)
WBC: 8.4 10*3/uL (ref 4.0–10.5)

## 2016-02-09 LAB — OB RESULTS CONSOLE HIV ANTIBODY (ROUTINE TESTING): HIV: NONREACTIVE

## 2016-02-09 LAB — BASIC METABOLIC PANEL: Glucose: 143 mg/dL

## 2016-02-09 LAB — TSH: TSH: 0.57 u[IU]/mL (ref <=5.90)

## 2016-02-09 MED ORDER — TETANUS-DIPHTH-ACELL PERTUSSIS 5-2.5-18.5 LF-MCG/0.5 IM SUSP
0.5000 mL | Freq: Once | INTRAMUSCULAR | Status: AC
  Administered 2016-02-09: 0.5 mL via INTRAMUSCULAR

## 2016-02-09 NOTE — Progress Notes (Signed)
Educated pt on Skin to Skin  

## 2016-02-09 NOTE — Progress Notes (Signed)
Subjective:  Heather Mejia is a 30 y.o. G2P1001 at [redacted]w[redacted]d being seen today for ongoing prenatal care.  She is currently monitored for the following issues for this high-risk pregnancy and has UNSPECIFIED HYPOTHYROIDISM; Anal fissure; Previous child with congenital anomaly, currently pregnant, antepartum; Supervision of high risk pregnancy, antepartum; and Monochorionic diamniotic twin pregnancy in second trimester on her problem list.  Patient reports no complaints.  Contractions: Not present. Vag. Bleeding: None.  Movement: Present. Denies leaking of fluid.   The following portions of the patient's history were reviewed and updated as appropriate: allergies, current medications, past family history, past medical history, past social history, past surgical history and problem list. Problem list updated.  Objective:   Filed Vitals:   02/09/16 0818  BP: 120/64  Pulse: 64  Temp: 97.5 F (36.4 C)  Weight: 183 lb (83.008 kg)    Fetal Status: Fetal Heart Rate (bpm): 152/146   Movement: Present     General:  Alert, oriented and cooperative. Patient is in no acute distress.  Skin: Skin is warm and dry. No rash noted.   Cardiovascular: Normal heart rate noted  Respiratory: Normal respiratory effort, no problems with respiration noted  Abdomen: Soft, gravid, appropriate for gestational age. Pain/Pressure: Present     Pelvic: Vag. Bleeding: None     Cervical exam deferred        Extremities: Normal range of motion.  Edema: Trace  Mental Status: Normal mood and affect. Normal behavior. Normal judgment and thought content.   Urinalysis:      Assessment and Plan:  Pregnancy: G2P1001 at [redacted]w[redacted]d  1. Monochorionic diamniotic twin pregnancy in second trimester Follow up US scheduled on 02/17/2016 1hr glucola, tdap and labs today - Glucose Tolerance, 1 HR (50g) w/o Fasting - RPR - CBC - HIV antibody (with reflex)  2. Supervision of high risk pregnancy, antepartum, third  trimester  - TSH  3. Previous child with congenital anomaly, currently pregnant, antepartum, not applicable or unspecified fetus   4. Hypothyroidism in pregnancy, unspecified trimester Continue Synthroid Will check TSH today - TSH  Preterm labor symptoms and general obstetric precautions including but not limited to vaginal bleeding, contractions, leaking of fluid and fetal movement were reviewed in detail with the patient. Please refer to After Visit Summary for other counseling recommendations.  No Follow-up on file.   Catalina Antigua, MD

## 2016-02-10 LAB — HIV ANTIBODY (ROUTINE TESTING W REFLEX): HIV: NONREACTIVE

## 2016-02-10 LAB — GLUCOSE TOLERANCE, 1 HOUR (50G) W/O FASTING: GLUCOSE 1 HOUR GTT: 143 mg/dL — AB (ref 70–140)

## 2016-02-10 LAB — TSH: TSH: 0.57 m[IU]/L

## 2016-02-10 LAB — RPR

## 2016-02-17 ENCOUNTER — Encounter (HOSPITAL_COMMUNITY): Payer: Self-pay

## 2016-02-17 ENCOUNTER — Other Ambulatory Visit (HOSPITAL_COMMUNITY): Payer: Self-pay | Admitting: Maternal and Fetal Medicine

## 2016-02-17 ENCOUNTER — Ambulatory Visit (HOSPITAL_COMMUNITY)
Admission: RE | Admit: 2016-02-17 | Discharge: 2016-02-17 | Disposition: A | Discharge status: Home / Self Care | Payer: Medicaid Other | Source: Ambulatory Visit | Attending: Family Medicine | Admitting: Family Medicine

## 2016-02-17 DIAGNOSIS — O99283 Endocrine, nutritional and metabolic diseases complicating pregnancy, third trimester: Secondary | ICD-10-CM

## 2016-02-17 DIAGNOSIS — O30033 Twin pregnancy, monochorionic/diamniotic, third trimester: Secondary | ICD-10-CM | POA: Insufficient documentation

## 2016-02-17 DIAGNOSIS — Z3A28 28 weeks gestation of pregnancy: Secondary | ICD-10-CM

## 2016-02-17 DIAGNOSIS — O99280 Endocrine, nutritional and metabolic diseases complicating pregnancy, unspecified trimester: Secondary | ICD-10-CM | POA: Diagnosis present

## 2016-02-17 DIAGNOSIS — Z8279 Family history of other congenital malformations, deformations and chromosomal abnormalities: Secondary | ICD-10-CM | POA: Diagnosis not present

## 2016-02-17 DIAGNOSIS — E039 Hypothyroidism, unspecified: Secondary | ICD-10-CM | POA: Insufficient documentation

## 2016-02-17 DIAGNOSIS — O30032 Twin pregnancy, monochorionic/diamniotic, second trimester: Secondary | ICD-10-CM

## 2016-02-17 DIAGNOSIS — O99282 Endocrine, nutritional and metabolic diseases complicating pregnancy, second trimester: Secondary | ICD-10-CM

## 2016-02-23 ENCOUNTER — Ambulatory Visit (INDEPENDENT_AMBULATORY_CARE_PROVIDER_SITE_OTHER): Payer: Medicaid Other | Admitting: Family Medicine

## 2016-02-23 ENCOUNTER — Encounter: Payer: Self-pay | Admitting: Family Medicine

## 2016-02-23 ENCOUNTER — Encounter: Payer: Self-pay | Admitting: *Deleted

## 2016-02-23 VITALS — BP 109/56 | HR 71 | Temp 98.0°F | Wt 186.5 lb

## 2016-02-23 DIAGNOSIS — O0993 Supervision of high risk pregnancy, unspecified, third trimester: Secondary | ICD-10-CM

## 2016-02-23 DIAGNOSIS — O99283 Endocrine, nutritional and metabolic diseases complicating pregnancy, third trimester: Secondary | ICD-10-CM

## 2016-02-23 DIAGNOSIS — O30033 Twin pregnancy, monochorionic/diamniotic, third trimester: Secondary | ICD-10-CM

## 2016-02-23 DIAGNOSIS — E039 Hypothyroidism, unspecified: Secondary | ICD-10-CM

## 2016-02-23 DIAGNOSIS — O30032 Twin pregnancy, monochorionic/diamniotic, second trimester: Secondary | ICD-10-CM

## 2016-02-23 LAB — POCT URINALYSIS DIP (DEVICE)
BILIRUBIN URINE: NEGATIVE
GLUCOSE, UA: NEGATIVE mg/dL
Ketones, ur: NEGATIVE mg/dL
NITRITE: NEGATIVE
PH: 7 (ref 5.0–8.0)
PROTEIN: 30 mg/dL — AB
Specific Gravity, Urine: 1.025 (ref 1.005–1.030)
Urobilinogen, UA: 0.2 mg/dL (ref 0.0–1.0)

## 2016-02-23 NOTE — Progress Notes (Signed)
Subjective:  Heather Mejia is a 30 y.o. G2P1001 at 7527w5d being seen today for ongoing prenatal care.  She is currently monitored for the following issues for this high-risk pregnancy and has UNSPECIFIED HYPOTHYROIDISM; Anal fissure; Previous child with congenital anomaly, currently pregnant, antepartum; Supervision of high risk pregnancy, antepartum; and Monochorionic diamniotic twin pregnancy in second trimester on her problem list.  Patient reports no complaints.  Contractions: Not present. Vag. Bleeding: None.  Movement: Present. Denies leaking of fluid.   The following portions of the patient's history were reviewed and updated as appropriate: allergies, current medications, past family history, past medical history, past social history, past surgical history and problem list. Problem list updated.  Objective:   Filed Vitals:   02/23/16 0811  BP: 109/56  Pulse: 71  Temp: 98 F (36.7 C)  Weight: 186 lb 8 oz (84.596 kg)    Fetal Status: Fetal Heart Rate (bpm): 123/138 Fundal Height: 32 cm Movement: Present     General:  Alert, oriented and cooperative. Patient is in no acute distress.  Skin: Skin is warm and dry. No rash noted.   Cardiovascular: Normal heart rate noted  Respiratory: Normal respiratory effort, no problems with respiration noted  Abdomen: Soft, gravid, appropriate for gestational age. Pain/Pressure: Absent     Pelvic: Vag. Bleeding: None     Cervical exam deferred        Extremities: Normal range of motion.  Edema: Trace  Mental Status: Normal mood and affect. Normal behavior. Normal judgment and thought content.   Urinalysis: Urine Protein: 1+ Urine Glucose: Negative  Assessment and Plan:  Pregnancy: G2P1001 at 4327w5d  1. Supervision of high risk pregnancy, antepartum, third trimester Abnormal 1 hour-->needs 3 hour  2. Monochorionic diamniotic twin pregnancy in second trimester Last U/s shows concordant growth, no evidence of TTTS  Preterm labor  symptoms and general obstetric precautions including but not limited to vaginal bleeding, contractions, leaking of fluid and fetal movement were reviewed in detail with the patient. Please refer to After Visit Summary for other counseling recommendations.  [facility] 2 weeks [facility]on 03[facility]08/2016[facility], needs 3 hour.   Reva Boresanya S Ziyan Schoon, MD

## 2016-02-23 NOTE — Patient Instructions (Signed)
Tercer trimestre de embarazo (Third Trimester of Pregnancy) El tercer trimestre comprende desde la semana29 hasta la semana42, es decir, desde el mes7 hasta el mes9. El tercer trimestre es un perodo en el que el feto crece rpidamente. Hacia el final del noveno mes, el feto mide alrededor de 20pulgadas (45cm) de largo y pesa entre 6 y 10 libras (2,700 y 4,500kg).  CAMBIOS EN EL ORGANISMO Su organismo atraviesa por muchos cambios durante el embarazo, y estos varan de una mujer a otra.   Seguir aumentando de peso. Es de esperar que aumente entre 25 y 35libras (11 y 16kg) hacia el final del embarazo.  Podrn aparecer las primeras estras en las caderas, el abdomen y las mamas.  Puede tener necesidad de orinar con ms frecuencia porque el feto baja hacia la pelvis y ejerce presin sobre la vejiga.  Debido al embarazo podr sentir acidez estomacal con frecuencia.  Puede estar estreida, ya que ciertas hormonas enlentecen los movimientos de los msculos que empujan los desechos a travs de los intestinos.  Pueden aparecer hemorroides o abultarse e hincharse las venas (venas varicosas).  Puede sentir dolor plvico debido al aumento de peso y a que las hormonas del embarazo relajan las articulaciones entre los huesos de la pelvis. El dolor de espalda puede ser consecuencia de la sobrecarga de los msculos que soportan la postura.  Tal vez haya cambios en el cabello que pueden incluir su engrosamiento, crecimiento rpido y cambios en la textura. Adems, a algunas mujeres se les cae el cabello durante o despus del embarazo, o tienen el cabello seco o fino. Lo ms probable es que el cabello se le normalice despus del nacimiento del beb.  Las mamas seguirn creciendo y le dolern. A veces, puede haber una secrecin amarilla de las mamas llamada calostro.  El ombligo puede salir hacia afuera.  Puede sentir que le falta el aire debido a que se expande el tero.  Puede notar que el feto  "baja" o lo siente ms bajo, en el abdomen.  Puede tener una prdida de secrecin mucosa con sangre. Esto suele ocurrir en el trmino de unos pocos das a una semana antes de que comience el trabajo de parto.  El cuello del tero se vuelve delgado y blando (se borra) cerca de la fecha de parto. QU DEBE ESPERAR EN LOS EXMENES PRENATALES  Le harn exmenes prenatales cada 2semanas hasta la semana36. A partir de ese momento le harn exmenes semanales. Durante una visita prenatal de rutina:  La pesarn para asegurarse de que usted y el feto estn creciendo normalmente.  Le tomarn la presin arterial.  Le medirn el abdomen para controlar el desarrollo del beb.  Se escucharn los latidos cardacos fetales.  Se evaluarn los resultados de los estudios solicitados en visitas anteriores.  Le revisarn el cuello del tero cuando est prxima la fecha de parto para controlar si este se ha borrado. Alrededor de la semana36, el mdico le revisar el cuello del tero. Al mismo tiempo, realizar un anlisis de las secreciones del tejido vaginal. Este examen es para determinar si hay un tipo de bacteria, estreptococo Grupo B. El mdico le explicar esto con ms detalle. El mdico puede preguntarle lo siguiente:  Cmo le gustara que fuera el parto.  Cmo se siente.  Si siente los movimientos del beb.  Si ha tenido sntomas anormales, como prdida de lquido, sangrado, dolores de cabeza intensos o clicos abdominales.  Si est consumiendo algn producto que contenga tabaco, como cigarrillos, tabaco   de mascar y cigarrillos electrnicos.  Si tiene alguna pregunta. Otros exmenes o estudios de deteccin que pueden realizarse durante el tercer trimestre incluyen lo siguiente:  Anlisis de sangre para controlar los niveles de hierro (anemia).  Controles fetales para determinar su salud, nivel de actividad y crecimiento. Si tiene alguna enfermedad o hay problemas durante el embarazo, le harn  estudios.  Prueba del VIH (virus de inmunodeficiencia humana). Si corre un riesgo alto, pueden realizarle una prueba de deteccin del VIH durante el tercer trimestre del embarazo. FALSO TRABAJO DE PARTO Es posible que sienta contracciones leves e irregulares que finalmente desaparecen. Se llaman contracciones de Braxton Hicks o falso trabajo de parto. Las contracciones pueden durar horas, das o incluso semanas, antes de que el verdadero trabajo de parto se inicie. Si las contracciones ocurren a intervalos regulares, se intensifican o se hacen dolorosas, lo mejor es que la revise el mdico.  SIGNOS DE TRABAJO DE PARTO   Clicos de tipo menstrual.  Contracciones cada 5minutos o menos.  Contracciones que comienzan en la parte superior del tero y se extienden hacia abajo, a la zona inferior del abdomen y la espalda.  Sensacin de mayor presin en la pelvis o dolor de espalda.  Una secrecin de mucosidad acuosa o con sangre que sale de la vagina. Si tiene alguno de estos signos antes de la semana37 del embarazo, llame a su mdico de inmediato. Debe concurrir al hospital para que la controlen inmediatamente. INSTRUCCIONES PARA EL CUIDADO EN EL HOGAR   Evite fumar, consumir hierbas, beber alcohol y tomar frmacos que no le hayan recetado. Estas sustancias qumicas afectan la formacin y el desarrollo del beb.  No consuma ningn producto que contenga tabaco, lo que incluye cigarrillos, tabaco de mascar y cigarrillos electrnicos. Si necesita ayuda para dejar de fumar, consulte al mdico. Puede recibir asesoramiento y otro tipo de recursos para dejar de fumar.  Siga las indicaciones del mdico en relacin con el uso de medicamentos. Durante el embarazo, hay medicamentos que son seguros de tomar y otros que no.  Haga ejercicio solamente como se lo haya indicado el mdico. Sentir clicos uterinos es un buen signo para detener la actividad fsica.  Contine comiendo alimentos sanos con  regularidad.  Use un sostn que le brinde buen soporte si le duelen las mamas.  No se d baos de inmersin en agua caliente, baos turcos ni saunas.  Use el cinturn de seguridad en todo momento mientras conduce.  No coma carne cruda ni queso sin cocinar; evite el contacto con las bandejas sanitarias de los gatos y la tierra que estos animales usan. Estos elementos contienen grmenes que pueden causar defectos congnitos en el beb.  Tome las vitaminas prenatales.  Tome entre 1500 y 2000mg de calcio diariamente comenzando en la semana20 del embarazo hasta el parto.  Si est estreida, pruebe un laxante suave (si el mdico lo autoriza). Consuma ms alimentos ricos en fibra, como vegetales y frutas frescos y cereales integrales. Beba gran cantidad de lquido para mantener la orina de tono claro o color amarillo plido.  Dese baos de asiento con agua tibia para aliviar el dolor o las molestias causadas por las hemorroides. Use una crema para las hemorroides si el mdico la autoriza.  Si tiene venas varicosas, use medias de descanso. Eleve los pies durante 15minutos, 3 o 4veces por da. Limite el consumo de sal en su dieta.  Evite levantar objetos pesados, use zapatos de tacones bajos y mantenga una buena postura.  Descanse   con las piernas elevadas si tiene calambres o dolor de cintura.  Visite a su dentista si no lo ha Occupational hygienist. Use un cepillo de dientes blando para higienizarse los dientes y psese el hilo dental con suavidad.  Puede seguir [employer], a menos que el mdico le indique lo contrario.  No haga viajes largos excepto que sea absolutamente necesario y solo con la autorizacin del mdico.  Tome clases prenatales para Financial trader, Education administrator y hacer preguntas sobre el [place] de parto y [place].  Haga un ensayo de la partida al hospital.  Prepare el bolso que llevar al hospital.  Prepare la habitacin del beb.  Concurra a todas  las visitas prenatales segn las indicaciones de su mdico. SOLICITE ATENCIN MDICA SI:  No est segura de que est en trabajo de parto o de que ha roto la bolsa de las aguas.  Tiene mareos.  Siente clicos leves, presin en la pelvis o dolor persistente en el abdomen.  Tiene nuseas, vmitos o diarrea persistentes.  Brett Fairy secrecin vaginal con mal olor.  Siente dolor al [employer]. SOLICITE ATENCIN MDICA DE INMEDIATO SI:   Tiene fiebre.  Tiene una prdida de lquido por la vagina.  Tiene sangrado o pequeas prdidas vaginales.  Siente dolor intenso o clicos en el abdomen.  Sube o baja de peso rpidamente.  Tiene dificultad para respirar y siente dolor de pecho.  Sbitamente se le hinchan mucho el rostro, las [place], los tobillos, los pies o las piernas.  No ha sentido los movimientos del beb durante Georgianne Fick.  Siente un dolor de cabeza intenso que no se alivia con medicamentos.  Su visin se modifica.   Esta informacin no tiene Theme park manager el consejo del mdico. Asegrese de hacerle al mdico cualquier pregunta que tenga.   Document Released: 09/15/2005 Document Revised: 12/27/2014 Elsevier Interactive Patient Education 2016 [employer].  Informacin sobre el parto prematuro  (Preterm Labor Information) El parto prematuro comienza antes de la semana 37 de [place]. La duracin de un embarazo normal es de 39 a 41 semanas.  CAUSAS  Generalmente no hay una causa que pueda identificarse del motivo por el que una mujer comienza un trabajo de parto prematuro. Sin embargo, una de las causas conocidas ms frecuentes son las infecciones. Las infecciones del tero, el cuello, la vagina, el lquido [place], la vejiga, los riones y Teacher, adult education de los pulmones (neumona) pueden hacer que el trabajo de parto se inicie. Otras causas que pueden sospecharse son:   Infecciones urogenitales, como infecciones por hongos y vaginosis bacteriana.   Anormalidades uterinas  (forma del tero, sptum uterino, fibromas, hemorragias en la placenta).   Un cuello que ha sido operado (puede ser que no permanezca cerrado).   Malformaciones del feto.   Gestaciones mltiples (mellizos, trillizos y ms).   Ruptura del saco amnitico.  FACTORES DE RIESGO   Historia previa de parto prematuro.   Tener ruptura prematura de las membranas (RPM).   La placenta cubre la abertura del cuello (placenta previa).   La placenta se separa del tero (abrupcin placentaria).   El cuello es demasiado dbil para contener al beb en el tero (cuello incompetente).   Hay mucho lquido en el saco amnitico (polihidramnios).   Consumo de drogas o hbito de fumar durante Firefighter.   No aumentar de peso lo suficiente durante el [employer].   Mujeres menores de 18 aos o mayores de [address] [place].   Nivel socioeconmico bajo.   Pertenecer a  la raza afroamericana. SNTOMAS  Los signos y sntomas del trabajo de parto prematuro son:   Public librarianClicos similares a los Designer, jewellerymenstruales, dolor abdominal o dolor de espalda.  Contracciones uterinas regulares, tan frecuentes como seis por hora, sin importar su intensidad (pueden ser suaves o dolorosas).  Contracciones que comienzan en la parte superior del tero y se expanden hacia abajo, a la zona inferior del abdomen y la espalda.   Sensacin de aumento de presin en la pelvis.   Aparece una secrecin acuosa o sanguinolenta por la vagina.  TRATAMIENTO  Segn el tiempo del embarazo y otras [place] Baycircunstancias, el mdico puede indicar reposo en cama. Si es necesario, le indicarn medicamentos para TEFL teacherdetener las contracciones y para Customer service managermadurar los pulmones del feto. Si el trabajo de parto se inicia antes de las 34 semanas de [place], se recomienda la hospitalizacin. El tratamiento depende de las condiciones en que se encuentren usted y el feto.  QU DEBE HACER SI PIENSA QUE EST EN TRABAJO DE PARTO PREMATURO?  Comunquese con su mdico  inmediatamente. Debe concurrir al hospital para ser controlada inmediatamente.  CMO PUEDE EVITAR EL TRABAJO DE PARTO PREMATURO EN FUTUROS EMBARAZOS?  Usted debe:   Si fuma, abandonar el hbito.  Mantener un peso saludable y evitar sustancias qumicas y drogas innecesarias.  Controlar todo tipo de infeccin.  Informe a su mdico si tiene una historia conocida de trabajo de parto prematuro.   Esta informacin no tiene Theme park managercomo fin reemplazar el consejo del mdico. Asegrese de hacerle al mdico cualquier pregunta que tenga.   Document Released: 03/14/2008 Document Revised: 08/08/2013 Elsevier Interactive Patient Education [employer]2016 Elsevier Inc.

## 2016-02-27 ENCOUNTER — Encounter (HOSPITAL_COMMUNITY): Payer: Self-pay

## 2016-03-02 ENCOUNTER — Ambulatory Visit (HOSPITAL_COMMUNITY)
Admission: RE | Admit: 2016-03-02 | Discharge: 2016-03-02 | Disposition: A | Discharge status: Home / Self Care | Payer: Medicaid Other | Source: Ambulatory Visit | Attending: Obstetrics & Gynecology | Admitting: Obstetrics & Gynecology

## 2016-03-02 ENCOUNTER — Encounter (HOSPITAL_COMMUNITY): Payer: Self-pay

## 2016-03-02 ENCOUNTER — Other Ambulatory Visit (HOSPITAL_COMMUNITY): Payer: Self-pay | Admitting: Maternal and Fetal Medicine

## 2016-03-02 VITALS — BP 135/87 | HR 71 | Wt 187.8 lb

## 2016-03-02 DIAGNOSIS — O99283 Endocrine, nutritional and metabolic diseases complicating pregnancy, third trimester: Secondary | ICD-10-CM

## 2016-03-02 DIAGNOSIS — Z3A30 30 weeks gestation of pregnancy: Secondary | ICD-10-CM | POA: Diagnosis not present

## 2016-03-02 DIAGNOSIS — Z8279 Family history of other congenital malformations, deformations and chromosomal abnormalities: Secondary | ICD-10-CM

## 2016-03-02 DIAGNOSIS — O30033 Twin pregnancy, monochorionic/diamniotic, third trimester: Secondary | ICD-10-CM

## 2016-03-02 DIAGNOSIS — E039 Hypothyroidism, unspecified: Secondary | ICD-10-CM | POA: Insufficient documentation

## 2016-03-02 DIAGNOSIS — O30032 Twin pregnancy, monochorionic/diamniotic, second trimester: Secondary | ICD-10-CM

## 2016-03-02 DIAGNOSIS — O30039 Twin pregnancy, monochorionic/diamniotic, unspecified trimester: Secondary | ICD-10-CM

## 2016-03-08 ENCOUNTER — Ambulatory Visit (INDEPENDENT_AMBULATORY_CARE_PROVIDER_SITE_OTHER): Payer: Medicaid Other | Admitting: Family Medicine

## 2016-03-08 VITALS — BP 128/69 | HR 72 | Temp 97.5°F | Wt 186.5 lb

## 2016-03-08 DIAGNOSIS — E038 Other specified hypothyroidism: Secondary | ICD-10-CM

## 2016-03-08 DIAGNOSIS — O30032 Twin pregnancy, monochorionic/diamniotic, second trimester: Secondary | ICD-10-CM

## 2016-03-08 DIAGNOSIS — L299 Pruritus, unspecified: Secondary | ICD-10-CM

## 2016-03-08 DIAGNOSIS — O1210 Gestational proteinuria, unspecified trimester: Secondary | ICD-10-CM | POA: Diagnosis not present

## 2016-03-08 DIAGNOSIS — E034 Atrophy of thyroid (acquired): Secondary | ICD-10-CM

## 2016-03-08 DIAGNOSIS — O99810 Abnormal glucose complicating pregnancy: Secondary | ICD-10-CM

## 2016-03-08 DIAGNOSIS — O30033 Twin pregnancy, monochorionic/diamniotic, third trimester: Secondary | ICD-10-CM

## 2016-03-08 DIAGNOSIS — O0993 Supervision of high risk pregnancy, unspecified, third trimester: Secondary | ICD-10-CM

## 2016-03-08 LAB — POCT URINALYSIS DIP (DEVICE)
Bilirubin Urine: NEGATIVE
GLUCOSE, UA: NEGATIVE mg/dL
Hgb urine dipstick: NEGATIVE
KETONES UR: NEGATIVE mg/dL
Leukocytes, UA: NEGATIVE
NITRITE: NEGATIVE
PH: 6 (ref 5.0–8.0)
PROTEIN: 100 mg/dL — AB
Specific Gravity, Urine: 1.025 (ref 1.005–1.030)
UROBILINOGEN UA: 0.2 mg/dL (ref 0.0–1.0)

## 2016-03-08 NOTE — Patient Instructions (Signed)
Lactancia materna (Breastfeeding) Decidir amamantar es una de las mejores elecciones que puede hacer por usted y su beb. El cambio hormonal durante el embarazo produce el desarrollo del tejido mamario y aumenta la cantidad y el tamao de los conductos galactforos. Estas hormonas tambin permiten que las protenas, los azcares y las grasas de la sangre produzcan la leche materna en las glndulas productoras de leche. Las hormonas impiden que la leche materna sea liberada antes del nacimiento del beb, adems de impulsar el flujo de leche luego del nacimiento. Una vez que ha comenzado a amamantar, pensar en el beb, as como la succin o el llanto, pueden estimular la liberacin de leche de las glndulas productoras de leche.  LOS BENEFICIOS DE AMAMANTAR Para el beb  La primera leche (calostro) ayuda a mejorar el funcionamiento del sistema digestivo del beb.  La leche tiene anticuerpos que ayudan a prevenir las infecciones en el beb.  El beb tiene una menor incidencia de asma, alergias y del sndrome de muerte sbita del lactante.  Los nutrientes en la leche materna son mejores para el beb que la leche maternizada y estn preparados exclusivamente para cubrir las necesidades del beb.  La leche materna mejora el desarrollo cerebral del beb.  Es menos probable que el beb desarrolle otras enfermedades, como obesidad infantil, asma o diabetes mellitus de tipo 2. Para usted   La lactancia materna favorece el desarrollo de un vnculo muy especial entre la madre y el beb.  Es conveniente. La leche materna siempre est disponible a la temperatura correcta y es econmica.  La lactancia materna ayuda a quemar caloras y a perder el peso ganado durante el embarazo.  Favorece la contraccin del tero al tamao que tena antes del embarazo de manera ms rpida y disminuye el sangrado (loquios) despus del parto.  La lactancia materna contribuye a reducir el riesgo de desarrollar diabetes  mellitus de tipo 2, osteoporosis o cncer de mama o de ovario en el futuro. SIGNOS DE QUE EL BEB EST HAMBRIENTO Primeros signos de hambre  Aumenta su estado de alerta o actividad.  Se estira.  Mueve la cabeza de un lado a otro.  Mueve la cabeza y abre la boca cuando se le toca la mejilla o la comisura de la boca (reflejo de bsqueda).  Aumenta las vocalizaciones, tales como sonidos de succin, se relame los labios, emite arrullos, suspiros, o chirridos.  Mueve la mano hacia la boca.  Se chupa con ganas los dedos o las manos. Signos tardos de hambre  Est agitado.  Llora de manera intermitente. Signos de hambre extrema Los signos de hambre extrema requerirn que lo calme y lo consuele antes de que el beb pueda alimentarse adecuadamente. No espere a que se manifiesten los siguientes signos de hambre extrema para comenzar a amamantar:   Agitacin.  Llanto intenso y fuerte.  Gritos. INFORMACIN BSICA SOBRE LA LACTANCIA MATERNA Iniciacin de la lactancia materna  Encuentre un lugar cmodo para sentarse o acostarse, con un buen respaldo para el cuello y la espalda.  Coloque una almohada o una manta enrollada debajo del beb para acomodarlo a la altura de la mama (si est sentada). Las almohadas para amamantar se han diseado especialmente a fin de servir de apoyo para los brazos y el beb mientras amamanta.  Asegrese de que el abdomen del beb est frente al suyo.   Masajee suavemente la mama. Con las yemas de los dedos, masajee la pared del pecho hacia el pezn en un movimiento circular.   Esto estimula el flujo de North Se[place]onkleche. Es posible que Engineer, manufacturing systemsdeba continuar este movimiento mientras amamanta si la leche fluye lentamente.  Sostenga la mama con el pulgar por arriba del pezn y los otros 4 dedos por debajo de la mama. Asegrese de que los dedos se encuentren lejos del pezn y de la boca del beb.  Empuje suavemente los labios del beb con el pezn o con el dedo.  Cuando la boca del  beb se abra lo suficiente, acrquelo rpidamente a la mama e introduzca todo el pezn y la zona oscura que lo rodea (areola), tanto como sea posible, dentro de la boca del beb.  Debe haber ms areola visible por arriba del labio superior del beb que por debajo del labio inferior.  La lengua del beb debe estar entre la enca inferior y la Bardwellmama.  Asegrese de que la boca del beb est en la posicin correcta alrededor del pezn (prendida). Los labios del beb deben crear un sello sobre la mama y estar doblados hacia afuera (invertidos).  Es comn que el beb succione durante 2 a 3 minutos para que comience el flujo de Quinnleche materna. Cmo debe prenderse Es muy importante que le ensee al beb cmo prenderse adecuadamente a la mama. Si el beb no se prende adecuadamente, puede causarle dolor en el pezn y reducir la produccin de Paradise Valleyleche materna, y hacer que el beb tenga un escaso aumento de North Tunicapeso. Adems, si el beb no se prende adecuadamente al pezn, puede tragar aire durante la alimentacin. Esto puede causarle molestias al beb. Hacer eructar al beb al Pilar Platecambiar de mama puede ayudarlo a liberar el aire. Sin embargo, ensearle al beb cmo prenderse a la mama adecuadamente es la mejor manera de evitar que se sienta molesto por tragar Oceanographeraire mientras se alimenta. Signos de que el beb se ha prendido adecuadamente al pezn:   Payton Doughtyironea o succiona de modo silencioso, sin causarle dolor.  Se escucha que traga cada 3 o 4 succiones.  Hay movimientos musculares por arriba y por delante de sus odos al Printmakersuccionar. Signos de que el beb no se ha prendido Audiological scientistadecuadamente al pezn:   Hace ruidos de succin o de chasquido mientras se alimenta.  Siente dolor en el pezn. Si cree que el beb no se prendi correctamente, deslice el dedo en la comisura de la boca y Ameren Corporationcolquelo entre las encas del beb para interrumpir la succin. Intente comenzar a amamantar nuevamente. Signos de Fish farm managerlactancia materna exitosa Signos  del beb:   Disminuye gradualmente el nmero de succiones o cesa la succin por completo.  Se duerme.  Relaja el cuerpo.  Retiene una pequea cantidad de Kindred Healthcareleche en la boca.  Se desprende solo del pecho. Signos que presenta usted:  Las mamas han aumentado la firmeza, el peso y el tamao 1 a 3 horas despus de Museum/gallery exhibitions officeramamantar.  Estn ms blandas inmediatamente despus de amamantar.  Un aumento del volumen de [place]leche, y tambin un cambio en su consistencia y color se producen hacia el quinto da de Tour managerlactancia materna.  Los pezones no duelen, ni estn agrietados ni sangran. Signos de que su beb recibe la cantidad de leche suficiente  Moja al menos 3 paales en 24 horas. La orina debe ser clara y de color amarillo plido a los [address]  [address] de [place]ida.  Defeca al menos 3 veces en 24 horas a los [address]  [address] de [address]. La materia fecal debe ser blanda y Grand Detouramarillenta.  Defeca al menos 3 veces en 24 horas a los  7 das de vida. La materia fecal debe ser grumosa y Schram Cityamarillenta.  No registra una prdida de peso mayor del 10% del peso al nacer durante los primeros 3 [address]  [address] de [place]ida.  Aumenta de peso un promedio de 4 a 7onzas (113 a 198g) por semana despus de los [address]  [address] de vida.  Aumenta de Hannasvillepeso, Waterbury Centerdiariamente, de Verdunvillemanera uniforme a Glass blower/designerpartir de los [address]  [address] de vida, sin Passenger transport managerregistrar prdida de peso despus de las 2semanas de vida. Despus de alimentarse, es posible que el beb regurgite una pequea cantidad. Esto es frecuente. FRECUENCIA Y DURACIN DE LA LACTANCIA MATERNA El amamantamiento frecuente la ayudar a producir ms Azerbaijanleche y a Education officer, communityprevenir problemas de Engineer, miningdolor en los pezones e hinchazn en las Wakarusamamas. Alimente al beb cuando muestre signos de hambre o si siente la necesidad de reducir la congestin de las Cabanmamas. Esto se denomina "lactancia a demanda". Evite el uso del chupete mientras trabaja para establecer la lactancia (las primeras 4 a 6 semanas despus del nacimiento del beb). Despus de este perodo, podr ofrecerle un  chupete. Las investigaciones demostraron que el uso del chupete durante el primer ao de vida del beb disminuye el riesgo de desarrollar el sndrome de muerte sbita del lactante (SMSL). Permita que el nio se alimente en cada mama todo lo que desee. Contine amamantando al beb hasta que haya terminado de alimentarse. Cuando el beb se desprende o se queda dormido mientras se est alimentando de la primera mama, ofrzcale la segunda. Debido a que, con frecuencia, los recin Sunoconacidos permanecen somnolientos las primeras semanas de vida, es posible que deba despertar al beb para alimentarlo. Los horarios de Acupuncturistlactancia varan de un beb a otro. Sin embargo, las siguientes reglas pueden servir como gua para ayudarla a Lawyergarantizar que el beb se alimenta adecuadamente:  Se puede amamantar a los recin nacidos (bebs de 4 semanas o menos de vida) cada 1 a 3 horas.  No deben transcurrir ms de 3 horas durante el da o 5 horas durante la noche sin que se amamante a los recin nacidos.  Debe amamantar al beb 8 veces como mnimo en un perodo de 24 horas, hasta que comience a introducir slidos en su dieta, a los 6 meses de vida aproximadamente. EXTRACCIN DE Dean Foods CompanyLECHE MATERNA La extraccin y Contractorel almacenamiento de la leche materna le permiten asegurarse de que el beb se alimente exclusivamente de Glencoeleche materna, aun en momentos en los que no puede amamantar. Esto tiene especial importancia si debe regresar al Aleen Campitrabajo en el perodo en que an est amamantando o si no puede estar presente en los momentos en que el beb debe alimentarse. Su asesor en lactancia puede orientarla sobre cunto tiempo es seguro almacenar Steamboatleche materna.  El sacaleche es un aparato que le permite extraer leche de la mama a un recipiente estril. Luego, la leche materna extrada puede almacenarse en un refrigerador o Electrical engineercongelador. Algunos sacaleches son Birdie Riddlemanuales, Delaney Meigsmientras que otros son elctricos. Consulte a su asesor en lactancia qu tipo ser  ms conveniente para usted. Los sacaleches se pueden comprar; sin embargo, algunos hospitales y grupos de apoyo a la lactancia materna alquilan Sports coachsacaleches mensualmente. Un asesor en lactancia puede ensearle cmo extraer W. R. Berkleyleche materna manualmente, en caso de que prefiera no usar un sacaleche.  CMO CUIDAR LAS MAMAS DURANTE LA LACTANCIA MATERNA Los pezones se secan, agrietan y duelen durante la Tour managerlactancia materna. Las siguientes recomendaciones pueden ayudarla a Pharmacologistmantener las TEPPCO Partnersmamas humectadas y sanas:  Careers information officervite usar jabn en los pezones.  Use un sostn de soporte. Aunque no son esenciales, las camisetas sin mangas o los sostenes especiales para Museum/gallery exhibitions officeramamantar estn diseados para acceder fcilmente a las mamas, para Museum/gallery exhibitions officeramamantar sin tener que quitarse todo el sostn o la camiseta. Evite usar sostenes con aro o sostenes muy ajustados.  Seque al aire sus pezones durante 3 a 4minutos despus de amamantar al beb.  Utilice solo apsitos de Haematologistalgodn en el sostn para Environmental health practitionerabsorber las prdidas de Abseconleche. La prdida de un poco de Public Service Enterprise Groupleche materna entre las tomas es normal.  Utilice lanolina sobre los pezones luego de Museum/gallery exhibitions officeramamantar. La lanolina ayuda a mantener la humedad normal de la piel. Si Botswanausa lanolina pura, no tiene que lavarse los pezones antes de volver a Corporate treasureralimentar al beb. La lanolina pura no es txica para el beb. Adems, puede extraer Beazer Homesmanualmente algunas gotas de Foremanleche materna y Engineer, maintenance (IT)masajear suavemente esa Winn-Dixieleche sobre los pezones, para que la Lake Henryleche se seque al aire. Durante las primeras semanas despus de dar a luz, algunas mujeres pueden experimentar hinchazn en las mamas (congestin Lakes of the Northmamaria). La congestin puede hacer que sienta las mamas pesadas, calientes y sensibles al tacto. El pico de la congestin ocurre dentro de los 3 a 5 das despus del Westonparto. Las siguientes recomendaciones pueden ayudarla a Paramedicaliviar la congestin:  Vace por completo las mamas al QUALCOMMamamantar o Environmental health practitionerextraer leche. Puede aplicar calor hmedo en las mamas  (en la ducha o con toallas hmedas para manos) antes de Museum/gallery exhibitions officeramamantar o extraer WPS Resourcesleche. Esto aumenta la circulacin y Saint Vincent and the Grenadinesayuda a que la Harrisleche fluya. Si el beb no vaca por completo las [address]amas cuando lo [address] Aveamamanta, extraiga la Claremoreleche restante despus de que haya finalizado.  Use un sostn ajustado (para amamantar o comn) o una camiseta sin mangas durante 1 o 2 das para indicar al cuerpo que disminuya ligeramente la produccin de Brushleche.  Aplique compresas de hielo Yahoo! Incsobre las mamas, a menos que le resulte demasiado incmodo.  Asegrese de que el beb est prendido y se encuentre en la posicin correcta mientras lo alimenta. Si la congestin persiste luego de 48 horas o despus de seguir estas recomendaciones, comunquese con su mdico o un Holiday representativeasesor en lactancia. RECOMENDACIONES GENERALES PARA EL CUIDADO DE LA SALUD DURANTE LA LACTANCIA MATERNA  Consuma alimentos saludables. Alterne comidas y colaciones, y coma 3 de cada una por da. Dado que lo que come Danaher Corporationafecta la leche materna, es posible que algunas comidas hagan que su beb se vuelva ms irritable de lo habitual. Evite comer este tipo de alimentos si percibe que afectan de manera negativa al beb.  Beba leche, jugos de fruta y agua para Patent examinersatisfacer su sed (aproximadamente 10 vasos al Futures traderda).  Descanse con frecuencia, reljese y tome sus vitaminas prenatales para evitar la fatiga, el estrs y la anemia.  Contine con los autocontroles de la mama.  Evite Product managermasticar y fumar tabaco. Las sustancias qumicas de los cigarrillos que pasan a la leche materna y la exposicin al humo ambiental del tabaco pueden daar al beb.  No consuma alcohol ni drogas, incluida la marihuana. Algunos medicamentos, que pueden ser perjudiciales para el beb, pueden pasar a travs de la Colgate Palmoliveleche materna. Es importante que consulte a su mdico antes de Medical sales representativetomar cualquier medicamento, incluidos todos los medicamentos recetados y de Mosqueroventa libre, as como los suplementos vitamnicos y  herbales. Puede quedar embarazada durante la lactancia. Si desea controlar la natalidad, consulte a su mdico cules son las opciones ms seguras para el beb. SOLICITE ATENCIN MDICA SI:   Daphane ShepherdUsted  siente que quiere dejar de Museum/gallery exhibitions officeramamantar o se siente frustrada con la lactancia.  Siente dolor en las mamas o en los pezones.  Sus pezones estn agrietados o Water quality scientistsangran.  Sus pechos estn irritados, sensibles o calientes.  Tiene un rea hinchada en cualquiera de las mamas.  Siente escalofros o fiebre.  Tiene nuseas o vmitos.  Presenta una secrecin de otro lquido distinto de la leche materna de los pezones.  Sus mamas no se llenan antes de Museum/gallery exhibitions officeramamantar al beb para el quinto da despus del South Lockportparto.  Se siente triste y deprimida.  El beb est demasiado somnoliento como para comer bien.  El beb tiene problemas para dormir.  Moja menos de 3 paales en 24 horas.  Defeca menos de 3 veces en 24 horas.  La piel del beb o la parte blanca de los ojos se vuelven amarillentas.  El beb no ha aumentado de Leonardvillepeso a los [address] das de [place]vida. SOLICITE ATENCIN MDICA DE INMEDIATO SI:   El beb est muy cansado Retail buyer(letargo) y no se quiere despertar para comer.  Le sube la fiebre sin causa.   Esta informacin no tiene Theme park managercomo fin reemplazar el consejo del mdico. Asegrese de hacerle al mdico cualquier pregunta que tenga.   Document Released: 12/06/2005 Document Revised: 08/27/2015 Elsevier Interactive Patient Education 2016 ArvinMeritorElsevier Inc.  Informacin sobre el parto prematuro  (Preterm Labor Information) El parto prematuro comienza antes de la semana 37 de Brinnonembarazo. La duracin de un embarazo normal es de 39 a 41 semanas.  CAUSAS  Generalmente no hay una causa que pueda identificarse del motivo por el que una mujer comienza un trabajo de parto prematuro. Sin embargo, una de las causas conocidas ms frecuentes son las infecciones. Las infecciones del tero, el cuello, la vagina, el lquido White Plainsamnitico, la  vejiga, los riones y Teacher, adult educationhasta de los pulmones (neumona) pueden hacer que el trabajo de parto se inicie. Otras causas que pueden sospecharse son:   Infecciones urogenitales, como infecciones por hongos y vaginosis bacteriana.   Anormalidades uterinas (forma del tero, sptum uterino, fibromas, hemorragias en la placenta).   Un cuello que ha sido operado (puede ser que no permanezca cerrado).   Malformaciones del feto.   Gestaciones mltiples (mellizos, trillizos y ms).   Ruptura del saco amnitico.  FACTORES DE RIESGO   Historia previa de parto prematuro.   Tener ruptura prematura de las membranas (RPM).   La placenta cubre la abertura del cuello (placenta previa).   La placenta se separa del tero (abrupcin placentaria).   El cuello es demasiado dbil para contener al beb en el tero (cuello incompetente).   Hay mucho lquido en el saco amnitico (polihidramnios).   Consumo de drogas o hbito de fumar durante Firefighterel embarazo.   No aumentar de peso lo suficiente durante el Big Lotsembarazo.   Mujeres menores de 18 aos o mayores de [address] [place] aos.   Nivel socioeconmico bajo.   Pertenecer a Engineer, productionla raza afroamericana. SNTOMAS  Los signos y sntomas del trabajo de parto prematuro son:   Public librarianClicos similares a los Designer, jewellerymenstruales, dolor abdominal o dolor de espalda.  Contracciones uterinas regulares, tan frecuentes como seis por hora, sin importar su intensidad (pueden ser suaves o dolorosas).  Contracciones que comienzan en la parte superior del tero y se expanden hacia abajo, a la zona inferior del abdomen y la espalda.   Sensacin de aumento de presin en la pelvis.   Aparece una secrecin acuosa o sanguinolenta por la vagina.  TRATAMIENTO  Segn el tiempo del  embarazo y [address] circunstancias, el mdico puede indicar reposo en cama. Si es necesario, le indicarn medicamentos para TEFL teacher las contracciones y para Customer service manager los pulmones del feto. Si el trabajo de parto se inicia  antes de las 34 semanas de [place], se recomienda la hospitalizacin. El tratamiento depende de las condiciones en que se encuentren usted y el feto.  QU DEBE HACER SI PIENSA QUE EST EN TRABAJO DE PARTO PREMATURO?  Comunquese con su mdico inmediatamente. Debe concurrir al hospital para ser controlada inmediatamente.  CMO PUEDE EVITAR EL TRABAJO DE PARTO PREMATURO EN FUTUROS EMBARAZOS?  Usted debe:   Si fuma, abandonar el hbito.  Mantener un peso saludable y evitar sustancias qumicas y drogas innecesarias.  Controlar todo tipo de infeccin.  Informe a su mdico si tiene una historia conocida de trabajo de parto prematuro.   Esta informacin no tiene Theme park manager el consejo del mdico. Asegrese de hacerle al mdico cualquier pregunta que tenga.   Document Released: 03/14/2008 Document Revised: 08/08/2013 Elsevier Interactive Patient Education Yahoo[employer] Inc.

## 2016-03-08 NOTE — Progress Notes (Signed)
Subjective:  Heather Mejia is a 30 y.o. G2P1001 at 754w5d being seen today for ongoing prenatal care.  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  Patient reports diffuse itching.  Contractions: Not present. Vag. Bleeding: None.  Movement: Present. Denies leaking of fluid.   The following portions of the patient's history were reviewed and updated as appropriate: allergies, current medications, past family history, past medical history, past social history, past surgical history and problem list. Problem list updated.  Objective:   Filed Vitals:   03/08/16 0812  BP: 128/69  Pulse: 72  Temp: 97.5 F (36.4 C)  Weight: 186 lb 8 oz (84.596 kg)    Fetal Status: Fetal Heart Rate (bpm): 132/136 Fundal Height: 35 cm Movement: Present     General:  Alert, oriented and cooperative. Patient is in no acute distress.  Skin: Skin is warm and dry. No rash noted.   Cardiovascular: Normal heart rate noted  Respiratory: Normal respiratory effort, no problems with respiration noted  Abdomen: Soft, gravid, appropriate for gestational age. Pain/Pressure: Absent     Pelvic: Vag. Bleeding: None     Cervical exam deferred        Extremities: Normal range of motion.  Edema: Trace  Mental Status: Normal mood and affect. Normal behavior. Normal judgment and thought content.   Urinalysis: Urine Protein: 2+ Urine Glucose: Negative U/S growth on 3/14  A: Breech, nml fluid, 3 lb 8 oz 49% B: breech, nml fluid, 3 lb 23% 15% discordance Assessment and Plan:  Pregnancy: G2P1001 at 274w5d  1. Supervision of high risk pregnancy, antepartum, third trimester Continue prenatal care.   2. Monochorionic diamniotic twin pregnancy  in second trimester Concordant growth--to begin 2x/wk testing at 32 wks  3. Hypothyroidism due to acquired atrophy of thyroid Normal TSH in Feb of this year  4. Abnormal glucose affecting pregnancy For 3 hour today - Glucose tolerance, 3 hours  5. Proteinuria affecting pregnancy - Culture, OB Urine  6. Itching - Bile acids, total  Preterm labor symptoms and general obstetric precautions including but not limited to vaginal bleeding, contractions, leaking of fluid and fetal movement were reviewed in detail with the patient. Please refer to After Visit Summary for other counseling recommendations.  Return in 2 weeks (on 03/22/2016) [facility] and NST, NST only in 3 days.   Reva Boresanya S Melanie Openshaw, MD

## 2016-03-08 NOTE — Progress Notes (Signed)
3 hour gtt started

## 2016-03-09 LAB — CULTURE, OB URINE
COLONY COUNT: NO GROWTH
Organism ID, Bacteria: NO GROWTH

## 2016-03-11 ENCOUNTER — Ambulatory Visit (INDEPENDENT_AMBULATORY_CARE_PROVIDER_SITE_OTHER): Payer: Medicaid Other | Admitting: *Deleted

## 2016-03-11 VITALS — BP 115/64 | HR 84

## 2016-03-11 DIAGNOSIS — O30033 Twin pregnancy, monochorionic/diamniotic, third trimester: Secondary | ICD-10-CM

## 2016-03-12 NOTE — Progress Notes (Signed)
3/23 NST reviewed and reactive x 2

## 2016-03-16 ENCOUNTER — Ambulatory Visit (INDEPENDENT_AMBULATORY_CARE_PROVIDER_SITE_OTHER): Payer: Medicaid Other | Admitting: General Practice

## 2016-03-16 ENCOUNTER — Encounter (HOSPITAL_COMMUNITY): Payer: Self-pay

## 2016-03-16 ENCOUNTER — Ambulatory Visit (HOSPITAL_COMMUNITY)
Admission: RE | Admit: 2016-03-16 | Discharge: 2016-03-16 | Disposition: A | Discharge status: Home / Self Care | Payer: Medicaid Other | Source: Ambulatory Visit | Attending: Maternal and Fetal Medicine | Admitting: Maternal and Fetal Medicine

## 2016-03-16 ENCOUNTER — Ambulatory Visit (HOSPITAL_COMMUNITY): Payer: 59

## 2016-03-16 VITALS — BP 130/77 | HR 64

## 2016-03-16 DIAGNOSIS — O99283 Endocrine, nutritional and metabolic diseases complicating pregnancy, third trimester: Secondary | ICD-10-CM

## 2016-03-16 DIAGNOSIS — Z8279 Family history of other congenital malformations, deformations and chromosomal abnormalities: Secondary | ICD-10-CM | POA: Insufficient documentation

## 2016-03-16 DIAGNOSIS — Z3A32 32 weeks gestation of pregnancy: Secondary | ICD-10-CM

## 2016-03-16 DIAGNOSIS — E038 Other specified hypothyroidism: Secondary | ICD-10-CM

## 2016-03-16 DIAGNOSIS — E039 Hypothyroidism, unspecified: Secondary | ICD-10-CM

## 2016-03-16 DIAGNOSIS — O30033 Twin pregnancy, monochorionic/diamniotic, third trimester: Secondary | ICD-10-CM | POA: Insufficient documentation

## 2016-03-16 DIAGNOSIS — O30039 Twin pregnancy, monochorionic/diamniotic, unspecified trimester: Secondary | ICD-10-CM

## 2016-03-16 NOTE — Progress Notes (Deleted)
Patient here for NST only today. 

## 2016-03-16 NOTE — Progress Notes (Signed)
NST reviewed and reactive x 2 

## 2016-03-17 ENCOUNTER — Encounter (HOSPITAL_COMMUNITY)
Admission: AD | Disposition: A | Discharge status: Home / Self Care | Payer: Self-pay | Source: Ambulatory Visit | Attending: Family Medicine

## 2016-03-17 ENCOUNTER — Encounter (HOSPITAL_COMMUNITY): Payer: Self-pay | Admitting: *Deleted

## 2016-03-17 ENCOUNTER — Inpatient Hospital Stay (HOSPITAL_COMMUNITY): Payer: Medicaid Other | Admitting: Anesthesiology

## 2016-03-17 ENCOUNTER — Inpatient Hospital Stay (HOSPITAL_COMMUNITY)
Admission: AD | Admit: 2016-03-17 | Discharge: 2016-03-19 | DRG: 765 | Disposition: A | Discharge status: Home / Self Care | Payer: Medicaid Other | Source: Ambulatory Visit | Attending: Family Medicine | Admitting: Family Medicine

## 2016-03-17 DIAGNOSIS — Z8261 Family history of arthritis: Secondary | ICD-10-CM

## 2016-03-17 DIAGNOSIS — O321XX1 Maternal care for breech presentation, fetus 1: Principal | ICD-10-CM | POA: Diagnosis present

## 2016-03-17 DIAGNOSIS — O99284 Endocrine, nutritional and metabolic diseases complicating childbirth: Secondary | ICD-10-CM | POA: Diagnosis present

## 2016-03-17 DIAGNOSIS — O30033 Twin pregnancy, monochorionic/diamniotic, third trimester: Secondary | ICD-10-CM | POA: Diagnosis present

## 2016-03-17 DIAGNOSIS — Z3A33 33 weeks gestation of pregnancy: Secondary | ICD-10-CM

## 2016-03-17 DIAGNOSIS — E039 Hypothyroidism, unspecified: Secondary | ICD-10-CM | POA: Diagnosis present

## 2016-03-17 DIAGNOSIS — O30032 Twin pregnancy, monochorionic/diamniotic, second trimester: Secondary | ICD-10-CM

## 2016-03-17 DIAGNOSIS — O321XX2 Maternal care for breech presentation, fetus 2: Secondary | ICD-10-CM | POA: Diagnosis present

## 2016-03-17 DIAGNOSIS — O0993 Supervision of high risk pregnancy, unspecified, third trimester: Secondary | ICD-10-CM

## 2016-03-17 DIAGNOSIS — O30043 Twin pregnancy, dichorionic/diamniotic, third trimester: Secondary | ICD-10-CM

## 2016-03-17 DIAGNOSIS — O352XX0 Maternal care for (suspected) hereditary disease in fetus, not applicable or unspecified: Secondary | ICD-10-CM

## 2016-03-17 DIAGNOSIS — O321XX0 Maternal care for breech presentation, not applicable or unspecified: Secondary | ICD-10-CM | POA: Diagnosis present

## 2016-03-17 LAB — CBC
HCT: 36.9 % (ref 36.0–46.0)
HEMOGLOBIN: 13 g/dL (ref 12.0–15.0)
MCH: 31.6 pg (ref 26.0–34.0)
MCHC: 35.2 g/dL (ref 30.0–36.0)
MCV: 89.8 fL (ref 78.0–100.0)
Platelets: 218 10*3/uL (ref 150–400)
RBC: 4.11 MIL/uL (ref 3.87–5.11)
RDW: 14.2 % (ref 11.5–15.5)
WBC: 9.4 10*3/uL (ref 4.0–10.5)

## 2016-03-17 LAB — URINALYSIS, ROUTINE W REFLEX MICROSCOPIC
BILIRUBIN URINE: NEGATIVE
GLUCOSE, UA: NEGATIVE mg/dL
KETONES UR: NEGATIVE mg/dL
Leukocytes, UA: NEGATIVE
NITRITE: NEGATIVE
PH: 6.5 (ref 5.0–8.0)
Protein, ur: >300 mg/dL — AB
Specific Gravity, Urine: 1.025 (ref 1.005–1.030)

## 2016-03-17 LAB — URINE MICROSCOPIC-ADD ON

## 2016-03-17 LAB — TYPE AND SCREEN
ABO/RH(D): O POS
ANTIBODY SCREEN: NEGATIVE

## 2016-03-17 LAB — ABO/RH: ABO/RH(D): O POS

## 2016-03-17 SURGERY — Surgical Case
Anesthesia: Spinal

## 2016-03-17 MED ORDER — LACTATED RINGERS IV BOLUS (SEPSIS)
1000.0000 mL | Freq: Once | INTRAVENOUS | Status: AC
  Administered 2016-03-17: 1000 mL via INTRAVENOUS

## 2016-03-17 MED ORDER — SIMETHICONE 80 MG PO CHEW
80.0000 mg | CHEWABLE_TABLET | ORAL | Status: DC
  Administered 2016-03-18 (×2): 80 mg via ORAL
  Filled 2016-03-17 (×2): qty 1

## 2016-03-17 MED ORDER — OXYTOCIN 10 UNIT/ML IJ SOLN
INTRAMUSCULAR | Status: AC
  Filled 2016-03-17: qty 4

## 2016-03-17 MED ORDER — NALBUPHINE HCL 10 MG/ML IJ SOLN: 5.0000 mg | INTRAMUSCULAR | Status: DC | PRN

## 2016-03-17 MED ORDER — ONDANSETRON HCL 4 MG/2ML IJ SOLN
INTRAMUSCULAR | Status: DC | PRN
  Administered 2016-03-17: 4 mg via INTRAVENOUS

## 2016-03-17 MED ORDER — CITRIC ACID-SODIUM CITRATE 334-500 MG/5ML PO SOLN
30.0000 mL | ORAL | Status: AC
  Administered 2016-03-17: 30 mL via ORAL

## 2016-03-17 MED ORDER — CEFAZOLIN SODIUM-DEXTROSE 2-4 GM/100ML-% IV SOLN
2.0000 g | INTRAVENOUS | Status: AC
  Administered 2016-03-17: 2 g via INTRAVENOUS
  Filled 2016-03-17: qty 100

## 2016-03-17 MED ORDER — DEXAMETHASONE SODIUM PHOSPHATE 4 MG/ML IJ SOLN
INTRAMUSCULAR | Status: AC
  Filled 2016-03-17: qty 1

## 2016-03-17 MED ORDER — ZOLPIDEM TARTRATE 5 MG PO TABS: 5.0000 mg | ORAL_TABLET | Freq: Every evening | ORAL | Status: DC | PRN

## 2016-03-17 MED ORDER — MORPHINE SULFATE (PF) 0.5 MG/ML IJ SOLN
INTRAMUSCULAR | Status: AC
  Filled 2016-03-17: qty 10

## 2016-03-17 MED ORDER — LACTATED RINGERS IV SOLN
INTRAVENOUS | Status: DC | PRN
  Administered 2016-03-17 (×2): via INTRAVENOUS

## 2016-03-17 MED ORDER — DIPHENHYDRAMINE HCL 25 MG PO CAPS: 25.0000 mg | ORAL_CAPSULE | Freq: Four times a day (QID) | ORAL | Status: DC | PRN

## 2016-03-17 MED ORDER — FAMOTIDINE IN NACL 20-0.9 MG/50ML-% IV SOLN
INTRAVENOUS | Status: AC
  Administered 2016-03-17: 20 mg
  Filled 2016-03-17: qty 50

## 2016-03-17 MED ORDER — OXYTOCIN 10 UNIT/ML IJ SOLN: 2.5000 [IU]/h | INTRAVENOUS | Status: AC

## 2016-03-17 MED ORDER — LANOLIN HYDROUS EX OINT: 1.0000 "application " | TOPICAL_OINTMENT | CUTANEOUS | Status: DC | PRN

## 2016-03-17 MED ORDER — SIMETHICONE 80 MG PO CHEW: 80.0000 mg | CHEWABLE_TABLET | ORAL | Status: DC | PRN

## 2016-03-17 MED ORDER — PRENATAL MULTIVITAMIN CH
1.0000 | ORAL_TABLET | Freq: Every day | ORAL | Status: DC
  Administered 2016-03-17 – 2016-03-18 (×2): 1 via ORAL
  Filled 2016-03-17 (×2): qty 1

## 2016-03-17 MED ORDER — NALOXONE HCL 0.4 MG/ML IJ SOLN: 0.4000 mg | INTRAMUSCULAR | Status: DC | PRN

## 2016-03-17 MED ORDER — SODIUM CHLORIDE 0.9% FLUSH: 3.0000 mL | INTRAVENOUS | Status: DC | PRN

## 2016-03-17 MED ORDER — PHENYLEPHRINE 8 MG IN D5W 100 ML (0.08MG/ML) PREMIX OPTIME
INJECTION | INTRAVENOUS | Status: AC
  Filled 2016-03-17: qty 100

## 2016-03-17 MED ORDER — DIPHENHYDRAMINE HCL 50 MG/ML IJ SOLN
12.5000 mg | INTRAMUSCULAR | Status: DC | PRN
Start: 2016-03-17 — End: 2016-03-19

## 2016-03-17 MED ORDER — MEPERIDINE HCL 25 MG/ML IJ SOLN
INTRAMUSCULAR | Status: AC
  Filled 2016-03-17: qty 1

## 2016-03-17 MED ORDER — BETAMETHASONE SOD PHOS & ACET 6 (3-3) MG/ML IJ SUSP
12.0000 mg | Freq: Once | INTRAMUSCULAR | Status: AC
  Administered 2016-03-17: 12 mg via INTRAMUSCULAR
  Filled 2016-03-17: qty 2

## 2016-03-17 MED ORDER — SIMETHICONE 80 MG PO CHEW
80.0000 mg | CHEWABLE_TABLET | Freq: Three times a day (TID) | ORAL | Status: DC
  Administered 2016-03-17 – 2016-03-19 (×5): 80 mg via ORAL
  Filled 2016-03-17 (×5): qty 1

## 2016-03-17 MED ORDER — ONDANSETRON HCL 4 MG/2ML IJ SOLN
4.0000 mg | Freq: Once | INTRAMUSCULAR | Status: AC
  Administered 2016-03-17: 4 mg via INTRAVENOUS
  Filled 2016-03-17: qty 2

## 2016-03-17 MED ORDER — EPHEDRINE 5 MG/ML INJ
INTRAVENOUS | Status: AC
  Filled 2016-03-17: qty 10

## 2016-03-17 MED ORDER — KETOROLAC TROMETHAMINE 30 MG/ML IJ SOLN
INTRAMUSCULAR | Status: AC
  Filled 2016-03-17: qty 1

## 2016-03-17 MED ORDER — PHENYLEPHRINE 8 MG IN D5W 100 ML (0.08MG/ML) PREMIX OPTIME
INJECTION | INTRAVENOUS | Status: DC | PRN
  Administered 2016-03-17: 60 ug/min via INTRAVENOUS

## 2016-03-17 MED ORDER — ONDANSETRON HCL 4 MG/2ML IJ SOLN: 4.0000 mg | Freq: Once | INTRAMUSCULAR | Status: DC | PRN

## 2016-03-17 MED ORDER — BUPIVACAINE HCL (PF) 0.25 % IJ SOLN
INTRAMUSCULAR | Status: DC | PRN
  Administered 2016-03-17: 30 mL

## 2016-03-17 MED ORDER — SENNOSIDES-DOCUSATE SODIUM 8.6-50 MG PO TABS
2.0000 | ORAL_TABLET | ORAL | Status: DC
  Administered 2016-03-17 – 2016-03-18 (×2): 2 via ORAL
  Filled 2016-03-17 (×2): qty 2

## 2016-03-17 MED ORDER — NALOXONE HCL 2 MG/2ML IJ SOSY
1.0000 ug/kg/h | PREFILLED_SYRINGE | INTRAVENOUS | Status: DC | PRN
  Filled 2016-03-17: qty 2

## 2016-03-17 MED ORDER — KETOROLAC TROMETHAMINE 30 MG/ML IJ SOLN
30.0000 mg | Freq: Four times a day (QID) | INTRAMUSCULAR | Status: AC | PRN
  Administered 2016-03-17: 30 mg via INTRAMUSCULAR

## 2016-03-17 MED ORDER — IBUPROFEN 600 MG PO TABS
600.0000 mg | ORAL_TABLET | Freq: Four times a day (QID) | ORAL | Status: DC
  Administered 2016-03-17 – 2016-03-19 (×7): 600 mg via ORAL
  Filled 2016-03-17 (×8): qty 1

## 2016-03-17 MED ORDER — ONDANSETRON HCL 4 MG/2ML IJ SOLN
INTRAMUSCULAR | Status: AC
  Filled 2016-03-17: qty 2

## 2016-03-17 MED ORDER — DEXAMETHASONE SODIUM PHOSPHATE 4 MG/ML IJ SOLN
INTRAMUSCULAR | Status: DC | PRN
  Administered 2016-03-17: 4 mg via INTRAVENOUS

## 2016-03-17 MED ORDER — MENTHOL 3 MG MT LOZG: 1.0000 | LOZENGE | OROMUCOSAL | Status: DC | PRN

## 2016-03-17 MED ORDER — DIPHENHYDRAMINE HCL 25 MG PO CAPS
25.0000 mg | ORAL_CAPSULE | ORAL | Status: DC | PRN
  Filled 2016-03-17: qty 1

## 2016-03-17 MED ORDER — ONDANSETRON HCL 4 MG/2ML IJ SOLN: 4.0000 mg | Freq: Three times a day (TID) | INTRAMUSCULAR | Status: DC | PRN

## 2016-03-17 MED ORDER — MORPHINE SULFATE (PF) 0.5 MG/ML IJ SOLN
INTRAMUSCULAR | Status: DC | PRN
  Administered 2016-03-17: .2 mg via INTRATHECAL

## 2016-03-17 MED ORDER — SODIUM CHLORIDE 0.9 % IR SOLN
Status: DC | PRN
  Administered 2016-03-17: 1000 mL

## 2016-03-17 MED ORDER — CITRIC ACID-SODIUM CITRATE 334-500 MG/5ML PO SOLN
ORAL | Status: AC
  Administered 2016-03-17: 30 mL via ORAL
  Filled 2016-03-17: qty 15

## 2016-03-17 MED ORDER — LACTATED RINGERS IV SOLN
INTRAVENOUS | Status: DC
  Administered 2016-03-17 (×2): via INTRAVENOUS

## 2016-03-17 MED ORDER — ACETAMINOPHEN 500 MG PO TABS
1000.0000 mg | ORAL_TABLET | Freq: Four times a day (QID) | ORAL | Status: AC
  Administered 2016-03-17 (×3): 1000 mg via ORAL
  Filled 2016-03-17 (×3): qty 2

## 2016-03-17 MED ORDER — MEPERIDINE HCL 25 MG/ML IJ SOLN
INTRAMUSCULAR | Status: DC | PRN
  Administered 2016-03-17: 12.5 mg via INTRAVENOUS

## 2016-03-17 MED ORDER — OXYCODONE HCL 5 MG PO TABS: 10.0000 mg | ORAL_TABLET | ORAL | Status: DC | PRN

## 2016-03-17 MED ORDER — NALBUPHINE HCL 10 MG/ML IJ SOLN: 5.0000 mg | Freq: Once | INTRAMUSCULAR | Status: DC | PRN

## 2016-03-17 MED ORDER — TETANUS-DIPHTH-ACELL PERTUSSIS 5-2.5-18.5 LF-MCG/0.5 IM SUSP: 0.5000 mL | Freq: Once | INTRAMUSCULAR | Status: DC

## 2016-03-17 MED ORDER — BUPIVACAINE HCL (PF) 0.25 % IJ SOLN
INTRAMUSCULAR | Status: AC
  Filled 2016-03-17: qty 30

## 2016-03-17 MED ORDER — SCOPOLAMINE 1 MG/3DAYS TD PT72
MEDICATED_PATCH | TRANSDERMAL | Status: DC | PRN
  Administered 2016-03-17: 1 via TRANSDERMAL

## 2016-03-17 MED ORDER — KETOROLAC TROMETHAMINE 30 MG/ML IJ SOLN: 30.0000 mg | Freq: Four times a day (QID) | INTRAMUSCULAR | Status: AC | PRN

## 2016-03-17 MED ORDER — FENTANYL CITRATE (PF) 100 MCG/2ML IJ SOLN
INTRAMUSCULAR | Status: AC
  Filled 2016-03-17: qty 2

## 2016-03-17 MED ORDER — LACTATED RINGERS IV SOLN
INTRAVENOUS | Status: DC | PRN
  Administered 2016-03-17 (×2): via INTRAVENOUS

## 2016-03-17 MED ORDER — BUPIVACAINE IN DEXTROSE 0.75-8.25 % IT SOLN
INTRATHECAL | Status: DC | PRN
  Administered 2016-03-17: 1.6 mL via INTRATHECAL

## 2016-03-17 MED ORDER — OXYCODONE HCL 5 MG PO TABS: 5.0000 mg | ORAL_TABLET | ORAL | Status: DC | PRN

## 2016-03-17 MED ORDER — ACETAMINOPHEN 325 MG PO TABS
650.0000 mg | ORAL_TABLET | ORAL | Status: DC | PRN
  Filled 2016-03-17: qty 2

## 2016-03-17 MED ORDER — LEVOTHYROXINE SODIUM 50 MCG PO TABS
50.0000 ug | ORAL_TABLET | Freq: Every day | ORAL | Status: DC
  Administered 2016-03-17 – 2016-03-19 (×3): 50 ug via ORAL
  Filled 2016-03-17 (×3): qty 1

## 2016-03-17 MED ORDER — DIBUCAINE 1 % RE OINT: 1.0000 "application " | TOPICAL_OINTMENT | RECTAL | Status: DC | PRN

## 2016-03-17 MED ORDER — WITCH HAZEL-GLYCERIN EX PADS: 1.0000 "application " | MEDICATED_PAD | CUTANEOUS | Status: DC | PRN

## 2016-03-17 MED ORDER — MEPERIDINE HCL 25 MG/ML IJ SOLN
6.2500 mg | INTRAMUSCULAR | Status: DC | PRN
Start: 2016-03-17 — End: 2016-03-17

## 2016-03-17 MED ORDER — FENTANYL CITRATE (PF) 100 MCG/2ML IJ SOLN
INTRAMUSCULAR | Status: DC | PRN
  Administered 2016-03-17: 10 ug via INTRATHECAL

## 2016-03-17 SURGICAL SUPPLY — 32 items
APL SKNCLS STERI-STRIP NONHPOA (GAUZE/BANDAGES/DRESSINGS) ×1
BENZOIN TINCTURE PRP APPL 2/3 (GAUZE/BANDAGES/DRESSINGS) ×2 IMPLANT
CLAMP CORD UMBIL (MISCELLANEOUS) IMPLANT
CLOSURE STERI STRIP 1/2 X4 (GAUZE/BANDAGES/DRESSINGS) ×2 IMPLANT
CLOTH BEACON ORANGE TIMEOUT ST (SAFETY) ×3 IMPLANT
DRSG OPSITE POSTOP 4X10 (GAUZE/BANDAGES/DRESSINGS) ×3 IMPLANT
DURAPREP 26ML APPLICATOR (WOUND CARE) ×3 IMPLANT
ELECT REM PT RETURN 9FT ADLT (ELECTROSURGICAL) ×3
ELECTRODE REM PT RTRN 9FT ADLT (ELECTROSURGICAL) ×1 IMPLANT
EXTRACTOR VACUUM M CUP 4 TUBE (SUCTIONS) IMPLANT
EXTRACTOR VACUUM M CUP 4' TUBE (SUCTIONS)
GLOVE BIOGEL PI IND STRL 7.0 (GLOVE) ×2 IMPLANT
GLOVE BIOGEL PI INDICATOR 7.0 (GLOVE) ×4
GLOVE ECLIPSE 7.0 STRL STRAW (GLOVE) ×6 IMPLANT
GOWN STRL REUS W/TWL LRG LVL3 (GOWN DISPOSABLE) ×6 IMPLANT
KIT ABG SYR 3ML LUER SLIP (SYRINGE) IMPLANT
NDL HYPO 25X5/8 SAFETYGLIDE (NEEDLE) IMPLANT
NEEDLE HYPO 22GX1.5 SAFETY (NEEDLE) ×3 IMPLANT
NEEDLE HYPO 25X5/8 SAFETYGLIDE (NEEDLE) IMPLANT
NS IRRIG 1000ML POUR BTL (IV SOLUTION) ×3 IMPLANT
PACK C SECTION WH (CUSTOM PROCEDURE TRAY) ×3 IMPLANT
PAD ABD 7.5X8 STRL (GAUZE/BANDAGES/DRESSINGS) ×2 IMPLANT
PAD OB MATERNITY 4.3X12.25 (PERSONAL CARE ITEMS) ×3 IMPLANT
PENCIL SMOKE EVAC W/HOLSTER (ELECTROSURGICAL) ×3 IMPLANT
RTRCTR C-SECT PINK 25CM LRG (MISCELLANEOUS) ×3 IMPLANT
SPONGE GAUZE 4X4 12PLY STER LF (GAUZE/BANDAGES/DRESSINGS) ×2 IMPLANT
SUT VIC AB 0 CTX 36 (SUTURE) ×9
SUT VIC AB 0 CTX36XBRD ANBCTRL (SUTURE) ×3 IMPLANT
SUT VIC AB 4-0 KS 27 (SUTURE) ×3 IMPLANT
SYR 30ML LL (SYRINGE) ×3 IMPLANT
TOWEL OR 17X24 6PK STRL BLUE (TOWEL DISPOSABLE) ×3 IMPLANT
TRAY FOLEY CATH SILVER 14FR (SET/KITS/TRAYS/PACK) ×3 IMPLANT

## 2016-03-17 NOTE — Progress Notes (Addendum)
[facility]  Delivery Note:  C-section       03/17/2016  5:32 AM  I was called to the operating room at the request of the patient's obstetrician (Dr. Shawnie PonsPratt) for a repeat c-section.  PRENATAL HX:  This is a 30 y/o G2P1001 at 7433 and 0/[redacted] weeks gestation with mo-di twins.  She presented in active labor completely dilated.  Delivery was by c-section for breech presentation of baby A.   She had an elevated 1 hour glucose test but a 3 hour was not performed.  Her GBS status is unknown.  She did not receive antibiotics and received a dose of BMZ just prior to delivery.  Her medical history is significant for hypothyroidism for which she is on synthroid.  AROM at delivery for both infants.     DELIVERY INFANT A:  Breech presentation.  Infant was vigorous at delivery, requiring no resuscitation other than standard warming, drying and stimulation.  APGARs 8 and 9.  Exam notable for bruising along lower extremities, otherwise was within normal limits.  Will admit to NICU for prematurity.    DELIVERY INFANT B:  Vertex presentation.  Infant was vigorous at delivery, requiring no resuscitation other than standard warming, drying and stimulation.  APGARs 8 and 9.  Exam within normal limits.  Will admit to NICU for prematurity.    _____________________ Electronically Signed By: Maryan CharLindsey Helio Lack, MD Neonatologist

## 2016-03-17 NOTE — Lactation Note (Signed)
This note was copied from a baby's chart. Lactation Consultation Note  Patient Name: Heather Mejia Today's Date: 03/17/2016 Reason for consult: Initial assessment;NICU baby;Multiple gestation NICU twins 5 hours old. Mom reports that she nursed her first child and would like to provide EBM for her twins. Mom is having abdominal pain, so assisted with initiating use of DEBP. Demonstrated to mom how to hand express, with only a little moisture present at right nipple. Mom pumped for 15 minutes with no colostrum present. Discussed normal progression of milk coming to volume and enc hand expressing after pumping. Mom given NICU booklet and LC brochure with review. Mom aware of EBM storage guidelines, OP/BFSG and LC phone line assistance after D/C. Mom [facility] and [facility]. Discussed relation of mom's hypothyroidism to breast milk supply and enc discussing with HCP. Enc mom to pump every 2-3 hours for 8 times/24 hours followed by hand expression.  Maternal Data Has patient been taught Hand Expression?: Yes Does the patient have breastfeeding experience prior to this delivery?: Yes  Feeding    LATCH Score/Interventions                      Lactation Tools Discussed/Used WIC Program: Yes Pump Review: Setup, frequency, and cleaning;Milk Storage Initiated by:: JW Date initiated:: 03/17/16   Consult Status Consult Status: Follow-up Date: 03/18/16 Follow-up type: In-patient    Geralynn OchsWILLIARD, [name] 03/17/2016, 11:18 AM

## 2016-03-17 NOTE — Progress Notes (Signed)
CSW acknowledges NICU admission.    Patient screened out for psychosocial assessment since none of the following apply:  Psychosocial stressors documented in mother or baby's chart  Gestation less than 32 weeks  Code at delivery   Infant with anomalies  Please contact the Clinical Social Worker if specific needs arise, or by MOB's request.       

## 2016-03-17 NOTE — Transfer of Care (Signed)
Immediate Anesthesia Transfer of Care Note  Patient: Heather Mejia  Procedure(s) Performed: Procedure(s): CESAREAN SECTION (N/A)  Patient Location: PACU  Anesthesia Type:Spinal  Level of Consciousness: awake, alert , oriented and patient cooperative  Airway & Oxygen Therapy: Patient Spontanous Breathing  Post-op Assessment: Report given to RN and Post -op Vital signs reviewed and stable  Post vital signs: Reviewed and stable  Last Vitals:  Filed Vitals:   03/17/16 0625 03/17/16 0630  BP: 130/78 130/78  Pulse: 72 77  Temp:  36.9 C  Resp: 7 20    Complications: No apparent anesthesia complications

## 2016-03-17 NOTE — Anesthesia Postprocedure Evaluation (Signed)
Anesthesia Post Note  Patient: Heather Mejia  Procedure(s) Performed: Procedure(s) (LRB): CESAREAN SECTION (N/A)  Patient location during evaluation: PACU Anesthesia Type: Spinal and MAC Level of consciousness: awake and alert Pain management: pain level controlled Vital Signs Assessment: post-procedure vital signs reviewed and stable Respiratory status: spontaneous breathing and respiratory function stable Cardiovascular status: blood pressure returned to baseline and stable Postop Assessment: spinal receding Anesthetic complications: no    Last Vitals:  Filed Vitals:   03/17/16 0800 03/17/16 0802  BP: 140/94   Pulse: 70   Temp:  36.7 C  Resp: 15     Last Pain:  Filed Vitals:   03/17/16 0806  PainSc: 0-No pain                 Kennieth RadFitzgerald, Jillana Selph E

## 2016-03-17 NOTE — H&P (Signed)
LABOR AND DELIVERY ADMISSION HISTORY AND PHYSICAL NOTE  Heather Mejia is a 30 y.o. female G2P1001 with IUP at 3319w0d by L/13 presenting for contractions. Painful and frequent beginning 8 pm. Leaking small amount of fluid, and small amount spotting. No fever. Feeling nauseaus.   She reports positive fetal movement.  Prenatal History/Complications:  Past Medical History: Past Medical History  Diagnosis Date  . Hypothyroidism     Past Surgical History: No past surgical history on file.  Obstetrical History: OB History    Gravida Para Term Preterm AB TAB SAB Ectopic Multiple Living   2 1 1       1       Social History: Social History   Social History  . Marital Status: Single    Spouse Name: N/A  . Number of Children: N/A  . Years of Education: N/A   Social History Main Topics  . Smoking status: Never Smoker   . Smokeless tobacco: Never Used  . Alcohol Use: No  . Drug Use: No  . Sexual Activity: Yes    Birth Control/ Protection: None   Other Topics Concern  . None   Social History Narrative    Family History: Family History  Problem Relation Age of Onset  . Polydactyly Son     post-axial on right foot; different father from current pregnancy  . Rheum arthritis Sister   . Cancer Paternal Uncle     Allergies: No Known Allergies  Prescriptions prior to admission  Medication Sig Dispense Refill Last Dose  . levothyroxine (SYNTHROID, LEVOTHROID) 50 MCG tablet Take 1 tablet (50 mcg total) by mouth daily. 30 tablet 4 03/16/2016 at Unknown time  . Prenatal Multivit-Min-Fe-FA (PRENATAL VITAMINS) 0.8 MG tablet Take 1 tablet by mouth daily. 30 tablet 12 03/16/2016 at Unknown time     Review of Systems   All systems reviewed and negative except as stated in HPI  Blood pressure 138/90, pulse 69, temperature 98 F (36.7 C), temperature source Oral, resp. rate 20, height 5\' 1"  (1.549 m), weight 191 lb (86.637 kg), last menstrual period 07/30/2015. General  appearance: alert, cooperative, appears stated age and moderate distress Lungs: clear to auscultation bilaterally Heart: regular rate and rhythm Abdomen: soft, non-tender; bowel sounds normal Extremities: No calf swelling or tenderness Presentation: breach, breach (on bedside u/s Fetal monitoring: 140/mod/+a/-d, 145/mod/+a/-d Uterine activity: q 3 min  Cervix: complete, breech  Prenatal labs: ABO, Rh: O/Positive/-- (10/31 0000) Antibody: Negative (10/31 0000) Rubella: !Error!imm RPR: Nonreactive (02/20 0000)  HBsAg: Negative (10/31 0000)  HIV: Non-reactive (02/20 0000)  GBS:   unk 1 hr Glucola: 145, 3-hour not eprformed Genetic screening:  First wnl Anatomy US: wnl, mo-di twins, female  Prenatal Transfer Tool  Maternal Diabetes: elevated 1-hour (145), 3- hour not performed Genetic Screening: Normal Maternal Ultrasounds/Referrals: Normal Fetal Ultrasounds or other Referrals:  None Maternal Substance Abuse:  No Significant Maternal Medications:  Meds include: Syntroid Significant Maternal Lab Results: Lab values include: Other: gbs unk  Results for orders placed or performed during the hospital encounter of 03/17/16 (from the past 24 hour(s))  Urinalysis, Routine w reflex microscopic (not at [facility])   Collection Time: 03/17/16  3:57 AM  Result Value Ref Range   Color, Urine YELLOW YELLOW   APPearance CLEAR CLEAR   Specific Gravity, Urine 1.025 1.005 - 1.030   pH 6.5 5.0 - 8.0   Glucose, UA NEGATIVE NEGATIVE mg/dL   Hgb urine dipstick MODERATE (A) NEGATIVE   Bilirubin Urine NEGATIVE NEGATIVE  Ketones, ur NEGATIVE NEGATIVE mg/dL   Protein, ur >425 (A) NEGATIVE mg/dL   Nitrite NEGATIVE NEGATIVE   Leukocytes, UA NEGATIVE NEGATIVE  Urine microscopic-add on   Collection Time: 03/17/16  3:57 AM  Result Value Ref Range   Squamous Epithelial / LPF 6-30 (A) NONE SEEN   WBC, UA 0-5 0 - 5 WBC/hpf   RBC / HPF 0-5 0 - 5 RBC/hpf   Bacteria, UA RARE (A) NONE SEEN    Patient Active  Problem List   Diagnosis Date Noted  . Supervision of high risk pregnancy, antepartum 11/19/2015  . Monochorionic diamniotic twin pregnancy in second trimester 11/19/2015  . Previous child with congenital anomaly, currently pregnant, antepartum 10/30/2015  . Anal fissure 02/19/2011  . Hypothyroidism 01/02/2009    Assessment: Heather Mejia is a 30 y.o. G2P1001 at [redacted]w[redacted]d here for contractions. In advanced labor, complete, with breech/breech twins  #Labor: to OR ugently for c/s. The risks of cesarean section were discussed with the patient including but were not limited to: bleeding which may require transfusion or reoperation; infection which may require antibiotics; injury to bowel, bladder, ureters or other surrounding organs; injury to the fetus; need for additional procedures including hysterectomy in the event of a life-threatening hemorrhage; placental abnormalities wth subsequent pregnancies, incisional problems, thromboembolic phenomenon and other postoperative/anesthesia complications.   #Pain: spinal #FWB: Cat 1 #ID:  gbs unk, to or ugently, will receive ancef perioperatively #MOF: br/bot #MOC: nexplanon #Circ:  Declines #preterm: bmz ordered #Hypothyroid: need to assess pre-preg synthroid dose to resume PP  Debi Cousin B Jernie Schutt 03/17/2016, 4:58 AM

## 2016-03-17 NOTE — Addendum Note (Signed)
Addendum  created 03/17/16 1459 by Elgie CongoNataliya H Jahlon Baines, CRNA   Modules edited: Clinical Notes   Clinical Notes:  File: [id number]

## 2016-03-17 NOTE — MAU Provider Note (Signed)
Please refer to H & P for same date of service (unable to delete this note)

## 2016-03-17 NOTE — Anesthesia Preprocedure Evaluation (Addendum)
Anesthesia Evaluation  Patient identified by MRN, date of birth, ID band Patient awake    Reviewed: Allergy & Precautions, NPO status , Patient's Chart, lab work & pertinent test results  Airway Mallampati: II  TM Distance: >3 FB Neck ROM: Full    Dental  (+) Teeth Intact, Dental Advisory Given   Pulmonary neg pulmonary ROS,    Pulmonary exam normal breath sounds clear to auscultation       Cardiovascular Exercise Tolerance: Good negative cardio ROS Normal cardiovascular exam Rhythm:Regular Rate:Normal     Neuro/Psych negative neurological ROS  negative psych ROS   GI/Hepatic negative GI ROS, Neg liver ROS,   Endo/Other  Hypothyroidism Obesity   Renal/GU negative Renal ROS     Musculoskeletal negative musculoskeletal ROS (+)   Abdominal   Peds  Hematology negative hematology ROS (+) Plt 218k Denies blood thinner use    Anesthesia Other Findings Day of surgery medications reviewed with the patient.  Reproductive/Obstetrics (+) Pregnancy 30 y.o. G2P1001 at 1956w0d here for contractions. In advanced labor, complete, with breech/breech twins                            Anesthesia Physical Anesthesia Plan  ASA: II and emergent  Anesthesia Plan: Spinal   Post-op Pain Management:    Induction:   Airway Management Planned:   Additional Equipment:   Intra-op Plan:   Post-operative Plan:   Informed Consent: I have reviewed the patients History and Physical, chart, labs and discussed the procedure including the risks, benefits and alternatives for the proposed anesthesia with the patient or authorized representative who has indicated his/her understanding and acceptance.   Dental advisory given  Plan Discussed with: CRNA, Anesthesiologist and Surgeon  Anesthesia Plan Comments: (Discussed risks and benefits of and differences between spinal and general. Discussed risks of spinal  including headache, backache, failure, bleeding, infection, and nerve damage. Patient consents to spinal. Questions answered. Coagulation studies and platelet count acceptable.)        Anesthesia Quick Evaluation

## 2016-03-17 NOTE — Anesthesia Procedure Notes (Signed)
Spinal Patient location during procedure: OR Staffing Anesthesiologist: Almeda Ezra EDWARD Performed by: anesthesiologist  Preanesthetic Checklist Completed: patient identified, surgical consent, pre-op evaluation, timeout performed, IV checked, risks and benefits discussed and monitors and equipment checked Spinal Block Patient position: sitting Prep: site prepped and draped and DuraPrep Patient monitoring: continuous pulse ox and blood pressure Approach: midline Location: L3-4 Needle Needle type: Pencan  Needle gauge: 24 G Assessment Sensory level: T4 Additional Notes Functioning IV was confirmed and monitors were applied. Sterile prep and drape, including hand hygiene, mask and sterile gloves were used. The patient was positioned and the spine was prepped. The skin was anesthetized with lidocaine.  Free flow of clear CSF was obtained prior to injecting local anesthetic into the CSF.  The spinal needle aspirated freely following injection.  The needle was carefully withdrawn.  The patient tolerated the procedure well. Consent was obtained prior to procedure with all questions answered and concerns addressed. Risks including but not limited to bleeding, infection, nerve damage, paralysis, failed block, inadequate analgesia, allergic reaction, high spinal, itching and headache were discussed and the patient wished to proceed.   Palmira Stickle, MD   

## 2016-03-17 NOTE — Op Note (Signed)
Cesarean Section Operative Report  Heather Mejia  03/17/2016  Indications: Breech Presentation   Pre-operative Diagnosis: Mapresentation .   Post-operative Diagnosis: Same   Surgeon: Surgeon(s) and Role:    * Reva Boresanya S Pratt, MD - Primary    * Kathrynn RunningNoah Bedford Wouk, MD - Assisting   Attending Attestation: I was present and scrubbed for the entire procedure.   Assistants: none  Anesthesia: spinal    Estimated Blood Loss: 1000 ml  Total IV Fluids: 2400 ml LR  Urine Output:: 100 ml clear yellow urine  Specimens: placenta to pathology  Findings:  Twin A: Viable female infant in breech presentation; Apgars 8/9; weight 2130 g; arterial cord pH not obtained; meconium-stained amniotic fluid; intact placenta with three vessel cord  Twin B: Viable female infant in cephalic presentation; Apgars 8/9; weight 1490 g; arterial cord pH not obtained; clear amniotic fluid; intact placenta with three vessel cord  Normal uterus, fallopian tubes and ovaries bilaterally.  Baby conditions / location:  NICU   Complications: EBL 1000 ml  Indications: Heather Mejia is a 30 y.o. [phone] with an IUP 3120w0d presenting in active labor, cervix fully dilated, with twin A breech.  The risks, benefits, complications, treatment options, and expected outcomes were discussed with the patient . The patient concurred with the proposed plan, giving informed consent. identified as Heather Mejia and the procedure verified as C-Section Delivery.  Procedure Details:  The patient was taken back to the operative suite where spinal anesthesia was placed.  A time out was held and the above information confirmed.   After induction of anesthesia, the patient was draped and prepped in the usual sterile manner and placed in a dorsal supine position with a leftward tilt. A Pfannenstiel incision was made and carried down through the subcutaneous tissue to the fascia. Fascial incision was  made and bluntly extended transversely. The fascia was separated from the underlying rectus tissue superiorly and inferiorly. The peritoneum was identified and bluntly entered and extended longitudinally. Alexis retractor was placed. A low transverse uterine incision was made and extended bluntly. Delivered from breech presentation was a viable infant A with Apgars and weight as above. The umbilical cord was clamped and cut cord blood was obtained for evaluation. Next delivered from vertex presentation was viable infant B with Apgars and weight as above. Cord ph was not sent. The placenta was removed Intact and appeared normal. The uterine outline, tubes and ovaries appeared normal. The uterine incision was closed with running locked sutures of 0Vicryl with an imbricating layer of the same.   Hemostasis was observed. The peritoneum was closed with 0 vicryl. The rectus muscles were examined and hemostasis observed. The fascia was then reapproximated with running sutures of 0Vicryl. A total of 30 ml 0.25% Marcaine was injected subcutaneously at the margins of the incision. The skin was closed with 4-0Vicryl.   Instrument, sponge, and needle counts were correct prior the abdominal closure and were correct at the conclusion of the case.     Disposition: PACU - hemodynamically stable.   Maternal Condition: stable       Signed: Lavonne Chickoah B WoukMD 03/17/2016 6:10 AM

## 2016-03-17 NOTE — Anesthesia Postprocedure Evaluation (Signed)
Anesthesia Post Note  Patient: Tsosie BillingMiranda Patricio Hernandez  Procedure(s) Performed: Procedure(s) (LRB): CESAREAN SECTION (N/A)  Patient location during evaluation: [facility] Anesthesia Type: Spinal Level of consciousness: awake and alert Pain management: pain level controlled Vital Signs Assessment: post-procedure vital signs reviewed and stable Respiratory status: spontaneous breathing Cardiovascular status: stable Postop Assessment: patient able to bend at knees, no signs of nausea or vomiting and adequate PO intake Anesthetic complications: no    Last Vitals:  Filed Vitals:   03/17/16 1135 03/17/16 1400  BP: 124/81 134/81  Pulse: 52 64  Temp: 36.5 C 36.4 C  Resp: 18 18    Last Pain:  Filed Vitals:   03/17/16 1434  PainSc: 0-No pain                 Dannie Woolen Hristova

## 2016-03-17 NOTE — MAU Note (Signed)
PT  SAYS SHE STARTED  [facility]  AT 8 PM -   HAS  TWINS -  PNC  -  HRC.   LAST SEX- LAST YEAR.     NO VE IN CLINIC.

## 2016-03-18 ENCOUNTER — Encounter (HOSPITAL_COMMUNITY): Payer: Self-pay | Admitting: Family Medicine

## 2016-03-18 LAB — CBC
HEMATOCRIT: 28 % — AB (ref 36.0–46.0)
Hemoglobin: 9.6 g/dL — ABNORMAL LOW (ref 12.0–15.0)
MCH: 30.9 pg (ref 26.0–34.0)
MCHC: 34.3 g/dL (ref 30.0–36.0)
MCV: 90 fL (ref 78.0–100.0)
PLATELETS: 205 10*3/uL (ref 150–400)
RBC: 3.11 MIL/uL — ABNORMAL LOW (ref 3.87–5.11)
RDW: 14.5 % (ref 11.5–15.5)
WBC: 13.5 10*3/uL — ABNORMAL HIGH (ref 4.0–10.5)

## 2016-03-18 NOTE — Progress Notes (Signed)
Patient ID: Tsosie BillingMiranda Patricio Hernandez, female   DOB: 1986/09/04, 30 y.o.   MRN: [id number]  POSTPARTUM PROGRESS NOTE  Post Partum Day 1  Subjective: Tsosie BillingMiranda Patricio Hernandez is a 30 y.o. [id number] POD#1 s/p PLTCS for breech mo-di twins at 3451w0d. She has a history of hypothyroidism well-controlled with levothyroxine. No acute events overnight. Pt denies problems with ambulating, voiding or po intake. She has not had bowel movement.Pain is well controlled with ibuprofen. Lochia is minimal. Pt denies lightheadedness, cold sensitivity, and heat intolerance.  Objective: BP 125/82 mmHg  Pulse 50  Temp(Src) 98.1 F (36.7 C) (Oral)  Resp 18  Ht 5\' 1"  (1.549 m)  Wt 86.637 kg (191 lb)  BMI 36.11 kg/m2  SpO2 100%  LMP 07/30/2015  Breastfeeding? Unknown   Recent Labs  03/17/16 0425 03/18/16 0529  HGB 13.0 9.6*  HCT 36.9 28.0*    Physical Exam:  General: alert, cooperative and no distress Lochia: appropriate Chest: CTAB Heart: RRR Abdomen: soft, fundus firm, incision clean and dry DVT Evaluation: no calf tenderness, Homan's sign negative Extremities: no edema  Assessment / Plan: Tsosie BillingMiranda Patricio Hernandez is a 30 y.o. [id number] POD#1 s/p PLTCS for breech mo-di twins at 1351w0d, doing well. She had two boys and is not planning on circumcision. Breast/bottle feeding. Plans to use Nexplanon for birth control.  -Continue levothyroxine 50 mcg po daily -Plan to discharge tomorrow or the next day  Felton ClintonKali Kati Riggenbach, Med Student 03/18/16  7:48 AM

## 2016-03-18 NOTE — Lactation Note (Addendum)
This note was copied from a baby's chart. Lactation Consultation Note  Patient Name: Heather CaffeyBoyA [name] Today's Date: 03/18/2016 Reason for consult: Initial assessment;NICU baby;Infant < 6lbs;Multiple gestation   Initial consult with Exp BF mom of 33 week twins in NICU. Mom declined need for interpreter but preferred Spanish handouts. Mom reports she is pumping and hand expressing every 2-3 hours. Mom is getting about 2 cc Colostrum. Enc mom to continue pumping/hand expressing and take all EBM to infant in NICU. Mom has been holding infants, encouraged her to pump post holding/visiting infants. Gave mom Spanish version of Providing Milk for your Baby in NICU Booklet and reviewed storage guidelines.  Mom has Colostrum Adult nurseCollection Containers and # stickers, she is to ask for BM labels in NICU. Mom without questions at this time. [facility] and has an appointment on Monday. She does not have a pump at home, discussed WIC pump rental with mom. Enc mom to call with questions/concerns. Gave mom Spanish [employer] Brochure, informed her of IP/OP Services, BF Support Groups and LC phone #. Will f/u tomorrow and prn   Maternal Data Formula Feeding for Exclusion: No Has patient been taught Hand Expression?: Yes Does the patient have breastfeeding experience prior to this delivery?: Yes  Feeding Feeding Type: Formula Length of feed: 20 min  LATCH Score/Interventions                      Lactation Tools Discussed/Used WIC Program: Yes Pump Review: Setup, frequency, and cleaning;Milk Storage Initiated by:: Bedside RN   Consult Status Consult Status: Follow-up Date: 03/19/16 Follow-up type: In-patient    Silas FloodSharon S Tomer Chalmers 03/18/2016, 4:10 PM

## 2016-03-19 ENCOUNTER — Other Ambulatory Visit: Payer: 59

## 2016-03-19 MED ORDER — IBUPROFEN 600 MG PO TABS: 600.0000 mg | ORAL_TABLET | Freq: Four times a day (QID) | ORAL | Status: AC

## 2016-03-19 MED ORDER — OXYCODONE-ACETAMINOPHEN 5-325 MG PO TABS: 1.0000 | ORAL_TABLET | Freq: Four times a day (QID) | ORAL | Status: DC | PRN

## 2016-03-19 MED ORDER — DOCUSATE SODIUM 100 MG PO CAPS: 100.0000 mg | ORAL_CAPSULE | Freq: Two times a day (BID) | ORAL | Status: DC | PRN

## 2016-03-19 NOTE — Discharge Instructions (Signed)
Cesarean Delivery, Care After  Refer to this sheet in the next few weeks. These instructions provide you with information on caring for yourself after your procedure. Your health care provider may also give you specific instructions. Your treatment has been planned according to current medical practices, but problems sometimes occur. Call your health care provider if you have any problems or questions after you go home.  HOME CARE INSTRUCTIONS   Only take over-the-counter or prescription medications as directed by your health care provider.   Do not drink alcohol, especially if you are breastfeeding or taking medication to relieve pain.   Do not chew or smoke tobacco.   Continue to use good perineal care. Good perineal care includes:    Wiping your perineum from front to back.    Keeping your perineum clean.   Check your surgical cut (incision) daily for increased redness, drainage, swelling, or separation of skin.   Clean your incision gently with soap and water every day, and then pat it dry. If your health care provider says it is okay, leave the incision uncovered. Use a bandage (dressing) if the incision is draining fluid or appears irritated. If the adhesive strips across the incision do not fall off within 7 days, carefully peel them off.   Hug a pillow when coughing or sneezing until your incision is healed. This helps to relieve pain.   Do not use tampons or douche until your health care provider says it is okay.   Shower, wash your hair, and take tub baths as directed by your health care provider.   Wear a well-fitting bra that provides breast support.   Limit wearing support panties or control-top hose.   Drink enough fluids to keep your urine clear or pale yellow.   Eat high-fiber foods such as whole grain cereals and breads, brown rice, beans, and fresh fruits and vegetables every day. These foods may help prevent or relieve constipation.   Resume activities such as climbing stairs,  driving, lifting, exercising, or traveling as directed by your health care provider.   Talk to your health care provider about resuming sexual activities. This is dependent upon your risk of infection, your rate of healing, and your comfort and desire to resume sexual activity.   Try to have someone help you with your household activities and your newborn for at least a few days after you leave the hospital.   Rest as much as possible. Try to rest or take a nap when your newborn is sleeping.   Increase your activities gradually.   Keep all of your scheduled postpartum appointments. It is very important to keep your scheduled follow-up appointments. At these appointments, your health care provider will be checking to make sure that you are healing physically and emotionally.  SEEK MEDICAL CARE IF:    You are passing large clots from your vagina. Save any clots to show your health care provider.   You have a foul smelling discharge from your vagina.   You have trouble urinating.   You are urinating frequently.   You have pain when you urinate.   You have a change in your bowel movements.   You have increasing redness, pain, or swelling near your incision.   You have pus draining from your incision.   Your incision is separating.   You have painful, hard, or reddened breasts.   You have a severe headache.   You have blurred vision or see spots.   You feel sad   or depressed.   You have thoughts of hurting yourself or your newborn.   You have questions about your care, the care of your newborn, or medications.   You are dizzy or light-headed.   You have a rash.   You have pain, redness, or swelling at the site of the removed intravenous access (IV) tube.   You have nausea or vomiting.   You stopped breastfeeding and have not had a menstrual period within 12 weeks of stopping.   You are not breastfeeding and have not had a menstrual period within 12 weeks of delivery.   You have a fever.  SEEK  IMMEDIATE MEDICAL CARE IF:   You have persistent pain.   You have chest pain.   You have shortness of breath.   You faint.   You have leg pain.   You have stomach pain.   Your vaginal bleeding saturates 2 or more sanitary pads in 1 hour.  MAKE SURE YOU:    Understand these instructions.   Will watch your condition.   Will get help right away if you are not doing well or get worse.     This information is not intended to replace advice given to you by your health care provider. Make sure you discuss any questions you have with your health care provider.     Document Released: 08/28/2002 Document Revised: 12/27/2014 Document Reviewed: 08/02/2012  Elsevier Interactive Patient Education 2016 [employer]

## 2016-03-19 NOTE — Lactation Note (Addendum)
This note was copied from a baby's chart. Lactation Consultation Note  Patient Name: Heather Mejia Today's Date: 03/19/2016   Mom reports that pumping is going well and that she is getting small volumes of EBM. She is taking breastmilk to infants in NICU. Mom plans to go home today. [facility].   Pump rental paperwork given. Mom has my phone # to call for pump rental prior to d/c. [facility]      Maternal Data    Feeding Feeding Type: Formula Length of feed: 20 min  LATCH Score/Interventions                      Lactation Tools Discussed/Used     Consult Status      Heather Mejia 03/19/2016, 9:17 AM

## 2016-03-19 NOTE — Progress Notes (Signed)
Patient discharged home with family... Discharge instructions reviewed with patient and she verbalized understanding... Condition stable... No equipment.Marland Kitchen. Marland Kitchen.Ambulated to car with Dolphus Jenny. Riley, NT.

## 2016-03-19 NOTE — Lactation Note (Signed)
This note was copied from a baby's chart. Lactation Consultation Note  Patient Name: Lynnette CaffeyBoyA Jermesha Patricio Hernandez Today's Date: 03/19/2016 Reason for consult: Follow-up assessment   Follow up with mom prior to d/c. WIC Pump Loaner completed. Mom reports her breasts are hurting, they are noted to be firm and lumpy. Engorgement prevention/treatment reviewed. Enc mom to ice breast 10-20 minutes prior to pumping and massage lumpy areas before and during pumping to relieve engorgement that is starting. Mom voiced understanding. Reviewed BM Storage for NICU babies. Mom has bottles and labels for milk storage. [facility], enc her to call with questions concerns prn.    Maternal Data    Feeding    LATCH Score/Interventions                      Lactation Tools Discussed/Used WIC Program: Yes Pump Review: Setup, frequency, and cleaning;Milk Storage   Consult Status Consult Status: PRN Follow-up type: Call as needed    Ed BlalockSharon S Jessy Cybulski 03/19/2016, 10:18 AM

## 2016-03-19 NOTE — Discharge Summary (Signed)
OB Discharge Summary     Patient Name: Heather Mejia DOB: 1985/12/31 MRN: [id number]  Date of admission: 03/17/2016 Delivering MD:    Buel Ream, [place] [[id number]]  Star, [address] [place] [[id number]]  Shonna Chock [place]    Date of discharge: 03/19/2016  Admitting diagnosis: 32 WEEKS CTX Intrauterine pregnancy: [redacted]w[redacted]d , mono-di twins    Secondary diagnosis:  Active Problems: Breech malpresentation Active preterm labor    Discharge diagnosis: Preterm Pregnancy Delivered and twin gestation                                                                                                Post partum procedures:None  Complications: Hemorrhage>1085mL  Hospital course:  Sceduled C/S   30 y.o. yo G2P1103 at [redacted]w[redacted]d was admitted to the hospital 03/17/2016 for urgent cesarean section with the following indication:Malpresentation and Multifetal Gestation in active preterm labor.  Membrane Rupture Time/Date:    Chizaram, Latino [[id number]]  5:29 AM   [address] [place] [[id number]]  5:35 AM  ,   Kendallyn, Lippold [[id number]]  03/17/2016   Ladeja, Pelham [[id number]]  03/17/2016    Patient delivered  Viable infants   Blaike, Newburn [[id number]]  03/17/2016   Taneesha, Edgin [[id number]]  03/17/2016  Details of operation can be found in separate operative note.  Patient had an uncomplicated postpartum course.  Hgb 9.6 postoperatively, no symptoms.  She is ambulating, tolerating a regular diet, passing flatus, and urinating well. Patient is discharged home in stable condition on  03/19/2016          Physical exam  Filed Vitals:   03/18/16 1200 03/18/16 1800 03/18/16 2127 03/19/16 0604  BP: 120/69 126/59 120/67 125/86  Pulse: 61 58 81 64  Temp: 97.6 F (36.4 C) 98.3 F (36.8 C) 99.3 F (37.4 C) 98.1 F (36.7 C)  TempSrc:  Oral Oral Oral Oral  Resp: Height:      Weight:      SpO2: 100% 100% 100% 100%   General: alert and no distress Lochia: appropriate Uterine Fundus: firm Incision: Healing well with no significant drainage, No significant erythema, Dressing is clean, dry, and intact DVT Evaluation: No evidence of DVT seen on physical exam. Negative Homan's sign. Labs: Lab Results  Component Value Date   WBC 13.5* 03/18/2016   HGB 9.6* 03/18/2016   HCT 28.0* 03/18/2016   MCV 90.0 03/18/2016   PLT 205 03/18/2016   CMP Latest Ref Rng 12/31/2008  Glucose 70-99 mg/dL 87  BUN 0-16 mg/dL 20  Creatinine 0.10-9.32 mg/dL 3.55  Sodium 732-202 meq/L 140  Potassium 3.5-5.3 meq/L 4.0  Chloride 96-112 meq/L 105  CO2 19-32 meq/L 22  Calcium 8.4-10.5 mg/dL 8.7    Discharge instruction: per After Visit Summary and "Baby and Me Booklet".  After visit meds:    Medication List    TAKE these medications        docusate sodium 100 MG capsule  Commonly known as:  COLACE  Take 1  capsule (100 mg total) by mouth 2 (two) times daily as needed.     ibuprofen 600 MG tablet  Commonly known as:  ADVIL,MOTRIN  Take 1 tablet (600 mg total) by mouth every 6 (six) hours.     levothyroxine 50 MCG tablet  Commonly known as:  SYNTHROID, LEVOTHROID  Take 1 tablet (50 mcg total) by mouth daily.     oxyCODONE-acetaminophen 5-325 MG tablet  Commonly known as:  PERCOCET/ROXICET  Take 1-2 tablets by mouth every 6 (six) hours as needed.     Prenatal Vitamins 0.8 MG tablet  Take 1 tablet by mouth daily.        Diet: routine diet  Activity: Advance as tolerated. Pelvic rest for 6 weeks.   Outpatient follow up:6 weeks Follow up Appt: Future Appointments Date Time Provider Department Center  03/30/2016 7:30 AM [facility] US 2 [facility]  04/14/2016 2:00 PM WGNFAOZWalidah Kennith GainN Karim, CNM [facility]   Postpartum contraception: Nexplanon  Newborn Data:   Lynnette Caffeyatricio Hernandez, BoyA Cina [[id number]][[id number]]  Live born  female  Birth Weight: 4 lb 11.1 oz (2130 g) APGAR: 8, [address]9   Patricio Timmothy SoursHernandez, BoyB Alajah [[id number]][[id number]]  Live born female  Birth Weight: 3 lb 4.6 oz (1490 g) APGAR: 8, 9  Baby Feeding: Bottle and Breast Disposition:NICU   03/19/2016 Tereso NewcomerANYANWU,Pati Thinnes A, MD

## 2016-03-22 ENCOUNTER — Other Ambulatory Visit: Payer: 59

## 2016-03-26 ENCOUNTER — Other Ambulatory Visit: Payer: 59

## 2016-03-30 ENCOUNTER — Ambulatory Visit (HOSPITAL_COMMUNITY): Payer: Medicaid Other

## 2016-04-14 ENCOUNTER — Ambulatory Visit: Payer: 59 | Admitting: Family

## 2016-04-22 ENCOUNTER — Encounter (INDEPENDENT_AMBULATORY_CARE_PROVIDER_SITE_OTHER): Payer: Self-pay

## 2016-04-23 ENCOUNTER — Encounter (INDEPENDENT_AMBULATORY_CARE_PROVIDER_SITE_OTHER): Payer: Self-pay

## 2016-05-12 ENCOUNTER — Encounter: Payer: Self-pay | Admitting: *Deleted

## 2016-05-12 NOTE — Telephone Encounter (Signed)
Erroneous encounter

## 2016-05-12 NOTE — Telephone Encounter (Deleted)
-----   Message from Catalina AntiguaPeggy Constant, MD sent at 05/11/2016 11:13 AM EDT ----- Please inform patient of abnormal 1 hour glucola and need for 3 hour test prior to her next appointment  Thanks  Peggy

## 2016-06-03 ENCOUNTER — Ambulatory Visit: Payer: Medicaid Other | Admitting: Medical

## 2016-06-03 ENCOUNTER — Encounter: Payer: Self-pay | Admitting: *Deleted

## 2016-06-03 NOTE — Progress Notes (Signed)
Patient no-showed for postpartum visit. Discussed case with Harlon FlorJ. Wenzel, PA, no major risk factors noted. Patient may reschedule as desired.

## 2016-07-12 ENCOUNTER — Encounter (HOSPITAL_COMMUNITY): Payer: Self-pay | Admitting: *Deleted

## 2016-07-12 ENCOUNTER — Encounter (HOSPITAL_COMMUNITY)
Admission: AD | Disposition: A | Discharge status: Home / Self Care | Payer: Self-pay | Source: Ambulatory Visit | Attending: Family Medicine

## 2016-07-12 ENCOUNTER — Ambulatory Visit (HOSPITAL_COMMUNITY)
Admission: AD | Admit: 2016-07-12 | Discharge: 2016-07-12 | Disposition: A | Discharge status: Home / Self Care | Payer: Self-pay | Source: Ambulatory Visit | Attending: Family Medicine | Admitting: Family Medicine

## 2016-07-12 ENCOUNTER — Inpatient Hospital Stay (HOSPITAL_COMMUNITY): Payer: Self-pay | Admitting: Anesthesiology

## 2016-07-12 ENCOUNTER — Inpatient Hospital Stay (HOSPITAL_COMMUNITY): Payer: Self-pay

## 2016-07-12 DIAGNOSIS — E039 Hypothyroidism, unspecified: Secondary | ICD-10-CM | POA: Insufficient documentation

## 2016-07-12 DIAGNOSIS — O209 Hemorrhage in early pregnancy, unspecified: Secondary | ICD-10-CM

## 2016-07-12 DIAGNOSIS — R109 Unspecified abdominal pain: Secondary | ICD-10-CM

## 2016-07-12 DIAGNOSIS — K661 Hemoperitoneum: Secondary | ICD-10-CM

## 2016-07-12 DIAGNOSIS — O009 Unspecified ectopic pregnancy without intrauterine pregnancy: Secondary | ICD-10-CM

## 2016-07-12 DIAGNOSIS — O001 Tubal pregnancy without intrauterine pregnancy: Secondary | ICD-10-CM | POA: Insufficient documentation

## 2016-07-12 HISTORY — DX: Chlamydial infection, unspecified: A74.9

## 2016-07-12 HISTORY — PX: BILATERAL SALPINGECTOMY: SHX5743

## 2016-07-12 HISTORY — DX: Unspecified abnormal cytological findings in specimens from vagina: R87.629

## 2016-07-12 HISTORY — PX: DIAGNOSTIC LAPAROSCOPY WITH REMOVAL OF ECTOPIC PREGNANCY: SHX6449

## 2016-07-12 LAB — URINALYSIS, ROUTINE W REFLEX MICROSCOPIC
BILIRUBIN URINE: NEGATIVE
Glucose, UA: NEGATIVE mg/dL
HGB URINE DIPSTICK: NEGATIVE
Ketones, ur: NEGATIVE mg/dL
Leukocytes, UA: NEGATIVE
Nitrite: NEGATIVE
PH: 5.5 (ref 5.0–8.0)
Protein, ur: NEGATIVE mg/dL
SPECIFIC GRAVITY, URINE: 1.01 (ref 1.005–1.030)

## 2016-07-12 LAB — CBC
HCT: 34.3 % — ABNORMAL LOW (ref 36.0–46.0)
HEMATOCRIT: 33.8 % — AB (ref 36.0–46.0)
Hemoglobin: 11.9 g/dL — ABNORMAL LOW (ref 12.0–15.0)
Hemoglobin: 10 g/dL — ABNORMAL LOW (ref 12.0–15.0)
MCH: 28.5 pg (ref 26.0–34.0)
MCH: 24.4 pg — ABNORMAL LOW (ref 26.0–34.0)
MCHC: 34.7 g/dL (ref 30.0–36.0)
MCHC: 29.6 g/dL — AB (ref 30.0–36.0)
MCV: 82.1 fL (ref 78.0–100.0)
MCV: 82.6 fL (ref 78.0–100.0)
PLATELETS: 337 10*3/uL (ref 150–400)
PLATELETS: 294 10*3/uL (ref 150–400)
RBC: 4.18 MIL/uL (ref 3.87–5.11)
RBC: 4.09 MIL/uL (ref 3.87–5.11)
RDW: 14 % (ref 11.5–15.5)
RDW: 14.1 % (ref 11.5–15.5)
WBC: 7.9 10*3/uL (ref 4.0–10.5)
WBC: 10.3 10*3/uL (ref 4.0–10.5)

## 2016-07-12 LAB — TYPE AND SCREEN
ABO/RH(D): O POS
ANTIBODY SCREEN: NEGATIVE

## 2016-07-12 LAB — WET PREP, GENITAL
CLUE CELLS WET PREP: NONE SEEN
SPERM: NONE SEEN
TRICH WET PREP: NONE SEEN
YEAST WET PREP: NONE SEEN

## 2016-07-12 LAB — POCT PREGNANCY, URINE: Preg Test, Ur: POSITIVE — AB

## 2016-07-12 LAB — ABO/RH: ABO/RH(D): O POS

## 2016-07-12 LAB — HCG, QUANTITATIVE, PREGNANCY: hCG, Beta Chain, Quant, S: 925 m[IU]/mL — ABNORMAL HIGH (ref <5)

## 2016-07-12 SURGERY — Surgical Case
Anesthesia: *Unknown

## 2016-07-12 SURGERY — LAPAROSCOPY, WITH ECTOPIC PREGNANCY SURGICAL TREATMENT
Anesthesia: General | Site: Abdomen | Laterality: Right

## 2016-07-12 MED ORDER — LACTATED RINGERS IV BOLUS (SEPSIS)
1000.0000 mL | Freq: Once | INTRAVENOUS | Status: AC
  Administered 2016-07-12: 1000 mL via INTRAVENOUS

## 2016-07-12 MED ORDER — SOD CITRATE-CITRIC ACID 500-334 MG/5ML PO SOLN
30.0000 mL | Freq: Once | ORAL | Status: AC
  Administered 2016-07-12: 30 mL via ORAL
  Filled 2016-07-12: qty 15

## 2016-07-12 MED ORDER — NEOSTIGMINE METHYLSULFATE 10 MG/10ML IV SOLN
INTRAVENOUS | Status: AC
  Filled 2016-07-12: qty 1

## 2016-07-12 MED ORDER — LACTATED RINGERS IR SOLN
Status: DC | PRN
  Administered 2016-07-12: 3000 mL

## 2016-07-12 MED ORDER — OXYCODONE-ACETAMINOPHEN 5-325 MG PO TABS: 1.0000 | ORAL_TABLET | Freq: Four times a day (QID) | ORAL | 0 refills | Status: DC | PRN

## 2016-07-12 MED ORDER — DEXAMETHASONE SODIUM PHOSPHATE 10 MG/ML IJ SOLN
INTRAMUSCULAR | Status: DC | PRN
Start: 2016-07-12 — End: 2016-07-12
  Administered 2016-07-12: 4 mg via INTRAVENOUS

## 2016-07-12 MED ORDER — NEOSTIGMINE METHYLSULFATE 10 MG/10ML IV SOLN
INTRAVENOUS | Status: DC | PRN
  Administered 2016-07-12: 3 mg via INTRAVENOUS

## 2016-07-12 MED ORDER — MIDAZOLAM HCL 2 MG/2ML IJ SOLN
INTRAMUSCULAR | Status: AC
  Filled 2016-07-12: qty 2

## 2016-07-12 MED ORDER — BUPIVACAINE HCL (PF) 0.25 % IJ SOLN
INTRAMUSCULAR | Status: AC
  Filled 2016-07-12: qty 30

## 2016-07-12 MED ORDER — LIDOCAINE HCL (CARDIAC) 20 MG/ML IV SOLN
INTRAVENOUS | Status: AC
  Filled 2016-07-12: qty 5

## 2016-07-12 MED ORDER — DEXAMETHASONE SODIUM PHOSPHATE 4 MG/ML IJ SOLN
INTRAMUSCULAR | Status: AC
  Filled 2016-07-12: qty 1

## 2016-07-12 MED ORDER — FENTANYL CITRATE (PF) 100 MCG/2ML IJ SOLN
INTRAMUSCULAR | Status: DC | PRN
  Administered 2016-07-12: 25 ug via INTRAVENOUS
  Administered 2016-07-12 (×3): 50 ug via INTRAVENOUS

## 2016-07-12 MED ORDER — GLYCOPYRROLATE 0.2 MG/ML IJ SOLN
INTRAMUSCULAR | Status: AC
  Filled 2016-07-12: qty 2

## 2016-07-12 MED ORDER — LIDOCAINE HCL (CARDIAC) 20 MG/ML IV SOLN
INTRAVENOUS | Status: DC | PRN
  Administered 2016-07-12: 80 mg via INTRAVENOUS

## 2016-07-12 MED ORDER — ROCURONIUM BROMIDE 100 MG/10ML IV SOLN
INTRAVENOUS | Status: DC | PRN
  Administered 2016-07-12: 5 mg via INTRAVENOUS
  Administered 2016-07-12: 45 mg via INTRAVENOUS

## 2016-07-12 MED ORDER — GLYCOPYRROLATE 0.2 MG/ML IJ SOLN
INTRAMUSCULAR | Status: DC | PRN
  Administered 2016-07-12: 0.4 mg via INTRAVENOUS
  Administered 2016-07-12: 0.2 mg via INTRAVENOUS

## 2016-07-12 MED ORDER — BUPIVACAINE HCL (PF) 0.25 % IJ SOLN
INTRAMUSCULAR | Status: DC | PRN
  Administered 2016-07-12: 10 mL

## 2016-07-12 MED ORDER — ONDANSETRON HCL 4 MG/2ML IJ SOLN
INTRAMUSCULAR | Status: AC
  Filled 2016-07-12: qty 2

## 2016-07-12 MED ORDER — KETOROLAC TROMETHAMINE 30 MG/ML IJ SOLN
INTRAMUSCULAR | Status: AC
  Filled 2016-07-12: qty 1

## 2016-07-12 MED ORDER — FENTANYL CITRATE (PF) 100 MCG/2ML IJ SOLN: 25.0000 ug | INTRAMUSCULAR | Status: DC | PRN

## 2016-07-12 MED ORDER — ONDANSETRON HCL 4 MG/2ML IJ SOLN
INTRAMUSCULAR | Status: DC | PRN
  Administered 2016-07-12: 4 mg via INTRAVENOUS

## 2016-07-12 MED ORDER — PROPOFOL 10 MG/ML IV BOLUS
INTRAVENOUS | Status: DC | PRN
  Administered 2016-07-12: 170 mg via INTRAVENOUS

## 2016-07-12 MED ORDER — PROPOFOL 10 MG/ML IV BOLUS
INTRAVENOUS | Status: AC
Start: 2016-07-12 — End: 2016-07-12
  Filled 2016-07-12: qty 20

## 2016-07-12 MED ORDER — MIDAZOLAM HCL 2 MG/2ML IJ SOLN
INTRAMUSCULAR | Status: DC | PRN
  Administered 2016-07-12: 2 mg via INTRAVENOUS

## 2016-07-12 MED ORDER — KETOROLAC TROMETHAMINE 30 MG/ML IJ SOLN
INTRAMUSCULAR | Status: DC | PRN
  Administered 2016-07-12: 30 mg via INTRAVENOUS

## 2016-07-12 MED ORDER — FAMOTIDINE IN NACL 20-0.9 MG/50ML-% IV SOLN
20.0000 mg | Freq: Once | INTRAVENOUS | Status: AC
  Administered 2016-07-12: 20 mg via INTRAVENOUS
  Filled 2016-07-12: qty 50

## 2016-07-12 MED ORDER — LACTATED RINGERS IV SOLN
INTRAVENOUS | Status: DC
  Administered 2016-07-12 (×3): via INTRAVENOUS

## 2016-07-12 MED ORDER — FENTANYL CITRATE (PF) 250 MCG/5ML IJ SOLN
INTRAMUSCULAR | Status: AC
  Filled 2016-07-12: qty 5

## 2016-07-12 MED ORDER — PROMETHAZINE HCL 25 MG/ML IJ SOLN: 6.2500 mg | INTRAMUSCULAR | Status: DC | PRN

## 2016-07-12 SURGICAL SUPPLY — 29 items
BAG SPEC RTRVL LRG 6X4 10 (ENDOMECHANICALS) ×1
CABLE HIGH FREQUENCY MONO STRZ (ELECTRODE) IMPLANT
CATH ROBINSON RED A/P 16FR (CATHETERS) IMPLANT
CLOTH BEACON ORANGE TIMEOUT ST (SAFETY) ×3 IMPLANT
DRSG COVADERM PLUS 2X2 (GAUZE/BANDAGES/DRESSINGS) ×2 IMPLANT
DRSG OPSITE POSTOP 3X4 (GAUZE/BANDAGES/DRESSINGS) ×2 IMPLANT
DURAPREP 26ML APPLICATOR (WOUND CARE) ×5 IMPLANT
GLOVE BIOGEL PI IND STRL 7.0 (GLOVE) ×2 IMPLANT
GLOVE BIOGEL PI INDICATOR 7.0 (GLOVE) ×8
GLOVE ECLIPSE 7.0 STRL STRAW (GLOVE) ×6 IMPLANT
GOWN STRL REUS W/TWL LRG LVL3 (GOWN DISPOSABLE) ×9 IMPLANT
NS IRRIG 1000ML POUR BTL (IV SOLUTION) ×3 IMPLANT
PACK LAPAROSCOPY BASIN (CUSTOM PROCEDURE TRAY) ×3 IMPLANT
PAD TRENDELENBURG POSITION (MISCELLANEOUS) ×3 IMPLANT
POUCH SPECIMEN RETRIEVAL 10MM (ENDOMECHANICALS) ×2 IMPLANT
SET IRRIG TUBING LAPAROSCOPIC (IRRIGATION / IRRIGATOR) ×2 IMPLANT
SHEARS HARMONIC ACE PLUS 36CM (ENDOMECHANICALS) IMPLANT
SLEEVE XCEL OPT CAN 5 100 (ENDOMECHANICALS) ×3 IMPLANT
SUT VIC AB 3-0 X1 27 (SUTURE) ×3 IMPLANT
SUT VICRYL 0 UR6 27IN ABS (SUTURE) ×6 IMPLANT
SUT VICRYL 4-0 PS2 18IN ABS (SUTURE) ×3 IMPLANT
TOWEL OR 17X24 6PK STRL BLUE (TOWEL DISPOSABLE) ×6 IMPLANT
TRAY FOLEY CATH SILVER 14FR (SET/KITS/TRAYS/PACK) ×3 IMPLANT
TROCAR BALLN 12MMX100 BLUNT (TROCAR) ×2 IMPLANT
TROCAR OPTI TIP 5M 100M (ENDOMECHANICALS) ×2 IMPLANT
TROCAR XCEL DIL TIP R 11M (ENDOMECHANICALS) IMPLANT
TROCAR XCEL NON-BLD 5MMX100MML (ENDOMECHANICALS) ×3 IMPLANT
WARMER LAPAROSCOPE (MISCELLANEOUS) ×3 IMPLANT
WATER STERILE IRR 1000ML POUR (IV SOLUTION) ×1 IMPLANT

## 2016-07-12 NOTE — Transfer of Care (Signed)
Immediate Anesthesia Transfer of Care Note  Patient: Heather Mejia  Procedure(s) Performed: Procedure(s): DIAGNOSTIC LAPAROSCOPY WITH REMOVAL OF ECTOPIC PREGNANCY (Right)  Patient Location: PACU  Anesthesia Type:General  Level of Consciousness: sedated  Airway & Oxygen Therapy: Patient Spontanous Breathing and Patient connected to nasal cannula oxygen  Post-op Assessment: Report given to RN  Post vital signs: Reviewed and stable  Last Vitals:  Vitals:   07/12/16 1558 07/12/16 1815  BP: 137/67 125/70  Pulse: 73 67  Resp: 18 16  Temp:  37.1 C    Last Pain:  Vitals:   07/12/16 1815  TempSrc: Oral  PainSc:          Complications: No apparent anesthesia complications

## 2016-07-12 NOTE — H&P (Addendum)
Heather Mejia is an 30 y.o. [phone] female.   Chief Complaint: abdominal pain HPI:  Heather Mejia is a 30 y.o. [phone] at [redacted]w[redacted]d by LMP who presents to maternity admissions reporting lower abdominal pain and some bleeding.  [facility].  The pain started today. Bleeding happened 2 weeks ago.   She denies vaginal itching/burning, urinary symptoms, h/a, dizziness, n/v, or fever/chills.    Had a C/S in March 2017, but did not keep her postpartum appointment in June.  She was to have had a Nexplanon inserted at that time.  Past Medical History:  Diagnosis Date  . Chlamydia   . Hypothyroidism   . Vaginal Pap smear, abnormal     Past Surgical History:  Procedure Laterality Date  . CESAREAN SECTION N/A 03/17/2016   Procedure: CESAREAN SECTION;  Surgeon: Reva Bores, MD;  Location: [facility];  Service: Obstetrics;  Laterality: N/A;    Family History  Problem Relation Age of Onset  . Cancer Paternal Uncle   . Polydactyly Son     post-axial on right foot; different father from current pregnancy  . Rheum arthritis Sister    Social History:  reports that she has never smoked. She has never used smokeless tobacco. She reports that she does not drink alcohol or use drugs.  Allergies: No Known Allergies  No current facility-administered medications on file prior to encounter.    Current Outpatient Prescriptions on File Prior to Encounter  Medication Sig Dispense Refill  . docusate sodium (COLACE) 100 MG capsule Take 1 capsule (100 mg total) by mouth 2 (two) times daily as needed. (Patient not taking: Reported on 07/12/2016) 30 capsule 2  . ibuprofen (ADVIL,MOTRIN) 600 MG tablet Take 1 tablet (600 mg total) by mouth every 6 (six) hours. (Patient not taking: Reported on 07/12/2016) 60 tablet 2  . levothyroxine (SYNTHROID, LEVOTHROID) 50 MCG tablet Take 1 tablet (50 mcg total) by mouth daily. (Patient not taking: Reported on 07/12/2016) 30 tablet  4  . oxyCODONE-acetaminophen (PERCOCET/ROXICET) 5-325 MG tablet Take 1-2 tablets by mouth every 6 (six) hours as needed. (Patient not taking: Reported on 07/12/2016) 30 tablet 0    Pertinent items are noted in HPI.  Blood pressure 137/67, pulse 73, temperature 98 F (36.7 C), temperature source Oral, resp. rate 18, height 5\' 1"  (1.549 m), weight 172 lb 6.4 oz (78.2 kg), last menstrual period 05/16/2016, unknown if currently breastfeeding. BP 137/67 (BP Location: Right Arm)   Pulse 73   Temp 98 F (36.7 C) (Oral)   Resp 18   Ht 5\' 1"  (1.549 m)   Wt 172 lb 6.4 oz (78.2 kg)   LMP 05/16/2016 (Approximate)   BMI 32.57 kg/m  General appearance: alert, cooperative and appears stated age Neck: supple, symmetrical, trachea midline Lungs: normal effort Heart: regular rate and rhythm Abdomen: soft, tender to palpation Extremities: extremities normal, atraumatic, no cyanosis or edema Skin: Skin color, texture, turgor normal. No rashes or lesions Neurologic: Grossly normal   Lab Results  Component Value Date   WBC 7.9 07/12/2016   HGB 11.9 (L) 07/12/2016   HCT 34.3 (L) 07/12/2016   MCV 82.1 07/12/2016   PLT 337 07/12/2016   Lab Results  Component Value Date   PREGTESTUR POSITIVE (A) 07/12/2016   US Ob Comp Less 14 Wks  Result Date: 07/12/2016 CLINICAL DATA:  Positive pregnancy test with pelvic pain. EXAM: OBSTETRIC <14 WK [place] AND TRANSVAGINAL OB US TECHNIQUE: Both  transabdominal and transvaginal ultrasound examinations were performed for complete evaluation of the gestation as well as the maternal uterus, adnexal regions, and pelvic cul-de-sac. Transvaginal technique was performed to assess early pregnancy. COMPARISON:  None. FINDINGS: Intrauterine gestational sac: None visualized. Subchorionic hemorrhage:  None visualized. Maternal uterus/adnexae: Maternal left ovary is unremarkable. In the right adnexal space, a 5.7 x 4.1 x 3.4 cm heterogeneous "mass" is identified containing a small  cystic structure in the periphery. This structure does not appear to represent normal ovarian parenchyma. Complex fluid is identified in the right adnexal space. There is complex fluid in the cul-de-sac, along the uterine fundus, and extending into the left adnexal space. Complex free fluid can be seen in cases of infection or hemorrhage, but given the history of pain and positive pregnancy test, hemoperitoneum is concern. IMPRESSION: 1. No intrauterine gestational sac visualized. 2. Complex "Mass" in the right adnexal space with an associated tiny cystic focus. This is associated with a moderate volume complex intraperitoneal free fluid. Given the history of pain and positive pregnancy test, features are suspicious for right ectopic pregnancy with right adnexal clot and hemoperitoneum. Critical Value/emergent results were called by me at the time of interpretation on 07/12/2016 at 5:06 pm to [facility] , who verbally acknowledged these results. Electronically Signed   By: Kennith Center M.D.   On: 07/12/2016 17:09  US Ob Transvaginal  Result Date: 07/12/2016 CLINICAL DATA:  Positive pregnancy test with pelvic pain. EXAM: OBSTETRIC <14 WK [place] AND TRANSVAGINAL OB US TECHNIQUE: Both transabdominal and transvaginal ultrasound examinations were performed for complete evaluation of the gestation as well as the maternal uterus, adnexal regions, and pelvic cul-de-sac. Transvaginal technique was performed to assess early pregnancy. COMPARISON:  None. FINDINGS: Intrauterine gestational sac: None visualized. Subchorionic hemorrhage:  None visualized. Maternal uterus/adnexae: Maternal left ovary is unremarkable. In the right adnexal space, a 5.7 x 4.1 x 3.4 cm heterogeneous "mass" is identified containing a small cystic structure in the periphery. This structure does not appear to represent normal ovarian parenchyma. Complex fluid is identified in the right adnexal space. There is complex fluid in the cul-de-sac, along  the uterine fundus, and extending into the left adnexal space. Complex free fluid can be seen in cases of infection or hemorrhage, but given the history of pain and positive pregnancy test, hemoperitoneum is concern. IMPRESSION: 1. No intrauterine gestational sac visualized. 2. Complex "Mass" in the right adnexal space with an associated tiny cystic focus. This is associated with a moderate volume complex intraperitoneal free fluid. Given the history of pain and positive pregnancy test, features are suspicious for right ectopic pregnancy with right adnexal clot and hemoperitoneum. Critical Value/emergent results were called by me at the time of interpretation on 07/12/2016 at 5:06 pm to [facility] , who verbally acknowledged these results. Electronically Signed   By: Kennith Center M.D.   On: 07/12/2016 17:09    Assessment/Plan Ruptured right ectopic pregnancy For diagnostic laparoscopy for removal of ectopic. Risks include but are not limited to bleeding, infection, injury to surrounding structures, including bowel, bladder and ureters, blood clots, and death.  Likelihood of success is high.    Dago Jungwirth S 07/12/2016, 5:48 PM

## 2016-07-12 NOTE — MAU Note (Signed)
Having pain in lower abd, started this morning.  Was told she was preg [facility] 3-4 wks ago.  Since then she had bleeding,started 7/9 thought it was her period

## 2016-07-12 NOTE — Discharge Instructions (Signed)
Embarazo ectpico (Ectopic Pregnancy) Un embarazo ectpico ocurre cuando el vulo fertilizado se fija (implanta) fuera del tero. La mayora de los embarazos ectpicos se producen en la trompa de [place]. Es raro que ocurran en el ovario, el intestino, la pelvis o el cuello uterino. En un embarazo ectpico, el vulo fertilizado no tiene la capacidad de llegar a ser un beb normal y sano.  Neomia Dear ruptura en el embarazo ectpico se produce cuando la trompa de Falopio se desgarra o se rompe y Futures trader como resultado una hemorragia interna. A menudo hay un intenso dolor abdominal y en algunos casos sangrado vaginal. Tener un embarazo ectpico puede ser Neomia Dear experiencia que pone en peligro la vida. Si no se trata, esta grave afeccin puede requerir una transfusin de Horace, ciruga abdominal, o incluso causar la muerte. CAUSAS  En la [employer] de los casos se sospecha un dao en las trompas de Falopio como causa del Multimedia programmer.  FACTORES DE RIESGO Dependiendo de sus circunstancias, el nivel de riesgo de tener un embarazo ectpico variar. El [place] de riesgo puede dividirse en tres categoras. Alto Riesgo  Usted ha realizado tratamientos para la infertilidad.  Ha tenido un embarazo ectpico previo.  Fue sometida a [place] de trompas.  Tuvo una ciruga previa para ligar las trompas de Falopio (ligadura de trompas).  Tiene problemas o enfermedades en las trompas.  Ha estado expuesta al DES. El DES es un medicamento que se [place] [employer] 1971 y [employer] en los bebs cuyas madres lo tomaron.  Queda embarazada mientras [place] un dispositivo intrauterino (DIU) como mtodo anticonceptivo. Riesgo moderado  Tiene antecedentes de infertilidad.  Ha sufrido alguna enfermedad de transmisin sexual (ETS).  Tiene antecedentes de enfermedad plvica inflamatoria (EPI).  Tiene cicatrices por endometriosis.  Tiene mltiples parejas sexuales.  Fuma. Bajo riesgo  Fue sometida a una ciruga plvica.  [place]  duchas vaginales.  Comenz a ser [employer] de los 18 aos de [place]. SIGNOS Y SNTOMAS  El embarazo ectpico se debe sospechar en cualquier mujer a la que le ha faltado un perodo y tiene dolor abdominal o sangrado.  Puede experimentar sntomas normales de Psychiatrist, tales como:  Nuseas.  Cansancio.  Inflamacin mamaria.  Otros sntomas son:  Futures trader.  Hemorragia vaginal o manchado irregular.  Clicos o dolor en uno de los lados o en la zona inferior del abdomen.  Latidos cardacos acelerados.  Desmayarse al defecar.  Los sntomas de un embarazo ectpico con ruptura y hemorragias internas son:  Dolor intenso y sbito en el abdomen y la pelvis.  Mareos o [employer].  Dolor en la zona del hombro. DIAGNSTICO  Los exmenes que se indicarn son:  Test de [place].  Una ecografa.  Prueba de nivel especfico de hormona del embarazo en el torrente sanguneo.  Extraccin de [place] de tejido del tero (dilatacin y curetaje, D y C).  Ciruga para Charity fundraiser visual del interior del abdomen usando un tubo delgado, que emite luz con una pequea cmara en el extremo (laparoscopio). TRATAMIENTO  Se podr aplicar una inyeccin de un medicamento denominado metotrexato. Este medicamento hace que se absorba el tejido del [place]. Se administra en los siguientes casos:  Hay un diagnstico temprano.  La trompa de Falopio no se ha roto.  Se la considera una buena candidata para recibir Research scientist (medical). Por lo general, despus del tratamiento con metotrexato se comprueban los niveles de hormonas del [place]. Esto se hace para asegurarse de que el  medicamento es efectivo. Puede llevar entre 4 y 6 semanas para que el Psychiatristembarazo sea absorbido (aunque la mayora de los embarazos se Environmental health practitionerabsorber a Agricultural engineer3semanas). Puede ser necesario realizar un tratamiento quirrgico. Se puede utilizar un laparoscopio para retirar el tejido del Psychiatristembarazo. Si  hay una hemorragia interna grave, se hace un corte (incisin) en la zona inferior del abdomen (laparotoma) y se extirpa el embarazo ectpico. De este modo de detendr el sangrado. Es posible que tambin se extirpe parte de la trompa de Falopio o la trompa entera (salpingectoma). Luego de la Junturaciruga, le harn un anlisis de hormona del embarazo para asegurarse de que no quedan tejidos. Quizs reciba la vacuna de inmunoglobulina Rho(D) si usted es Rh negativa y el padre es Rh positivo, o si no conoce el tipo de Rh del padre. Esto evita problemas en prximos embarazos. SOLICITE ATENCIN MDICA DE INMEDIATO SI:  Tiene sntomas de embarazo ectpico. Esto es una emergencia mdica. ASEGRESE DE QUE:  Comprende estas instrucciones.  Controlar su afeccin.  Recibir ayuda de inmediato si no mejora o si empeora.   Esta informacin no tiene Theme park managercomo fin reemplazar el consejo del mdico. Asegrese de hacerle al mdico cualquier pregunta que tenga.   Document Released: 12/06/2005 Document Revised: 12/27/2014 Elsevier Interactive Patient Education [employer]2016 Elsevier Inc.

## 2016-07-12 NOTE — Anesthesia Procedure Notes (Signed)
Procedure Name: Intubation Date/Time: 07/12/2016 6:44 PM Performed by: Cephus Shelling A Pre-anesthesia Checklist: Patient identified, Emergency Drugs available, Suction available and Patient being monitored Patient Re-evaluated:Patient Re-evaluated prior to inductionOxygen Delivery Method: Circle system utilized Preoxygenation: Pre-oxygenation with 100% oxygen Intubation Type: IV induction, Cricoid Pressure applied and Rapid sequence Ventilation: Mask ventilation without difficulty Laryngoscope Size: Mac and 3 Grade View: Grade I Tube type: Oral Tube size: 7.0 mm Number of attempts: 1 Placement Confirmation: ETT inserted through vocal cords under direct vision,  positive ETCO2 and breath sounds checked- equal and bilateral Secured at: 21 cm Tube secured with: Tape Dental Injury: Teeth and Oropharynx as per pre-operative assessment

## 2016-07-12 NOTE — Anesthesia Preprocedure Evaluation (Addendum)
Anesthesia Evaluation  Patient identified by MRN, date of birth, ID band Patient awake and Patient unresponsive    Airway Mallampati: II  TM Distance: >3 FB Neck ROM: Full    Dental no notable dental hx.    Pulmonary neg pulmonary ROS,    Pulmonary exam normal        Cardiovascular negative cardio ROS Normal cardiovascular exam     Neuro/Psych negative neurological ROS  negative psych ROS   GI/Hepatic negative GI ROS, Neg liver ROS,   Endo/Other  negative endocrine ROSHypothyroidism   Renal/GU negative Renal ROS  negative genitourinary   Musculoskeletal negative musculoskeletal ROS (+)   Abdominal Normal abdominal exam  (+)   Peds negative pediatric ROS (+)  Hematology negative hematology ROS (+)   Anesthesia Other Findings   Reproductive/Obstetrics (+) Pregnancy                            Anesthesia Physical Anesthesia Plan  ASA: II and emergent  Anesthesia Plan: General ETT   Post-op Pain Management:    Induction:   Airway Management Planned:   Additional Equipment:   Intra-op Plan:   Post-operative Plan:   Informed Consent: I have reviewed the patients History and Physical, chart, labs and discussed the procedure including the risks, benefits and alternatives for the proposed anesthesia with the patient or authorized representative who has indicated his/her understanding and acceptance.   Dental Advisory Given  Plan Discussed with:   Anesthesia Plan Comments:         Anesthesia Quick Evaluation

## 2016-07-12 NOTE — Op Note (Signed)
Heather Mejia  PROCEDURE DATE: 07/12/2016  PREOPERATIVE DIAGNOSIS: Ruptured ectopic pregnancy  POSTOPERATIVE DIAGNOSIS: Ruptured right fallopian tube ectopic pregnancy  PROCEDURE: Laparoscopic right salpingectomy   SURGEON:  Dr. Tinnie Gens  ASSISTANT: None  ANESTHESIOLOGIST: Sherrian Divers, MD  INDICATIONS: 30 y.o. [id number] at [redacted]w[redacted]d here for with ruptured ectopic pregnancy with blood type O pos. Patient was counseled regarding need for laparoscopic salpingectomy. Risks of surgery including bleeding which may require transfusion or reoperation, infection, injury to bowel or other surrounding organs, need for additional procedures including laparotomy and other postoperative/anesthesia complications were explained to patient.  Written informed consent was obtained.  FINDINGS:  moderate amount of hemoperitoneum estimated to be about 200cc of blood and clots.  Dilated right fallopian tube containing ectopic gestation. Small normal appearing uterus, normal left fallopian tube, left ovary and right ovary.  ANESTHESIA: General  SPECIMENS: right fallopian tube to pathology  COMPLICATIONS: None immediate  PROCEDURE IN DETAIL:  The patient was taken to the operating room where general anesthesia was administered and was found to be adequate.  She was placed in the dorsal lithotomy position, and was prepped and draped in a sterile manner.  A Foley catheter was inserted into her bladder and attached to constant drainage and a uterine manipulator was then advanced into the uterus .  After an adequate timeout was performed, attention was then turned to the patient's abdomen where a 10-mm skin incision was made on the umbilical fold. Fascia and peritoneum were entered sharply. A 0 Vicryl sutures were used to tag the fascia circumferentially.  A Hassan trocar was placed. The laparoscope was introduced.  A survey of the patient's pelvis and abdomen revealed the findings as above. Two left lower  quadrant ports were placed under direct visualization; 5-mm each Attention was then turned to the right fallopian tube which was grasped and ligated from the underlying mesosalpinx and uterine attachment using the Harmonic scalpel.  Good hemostasis was noted.  The specimen was placed in an EndoCatch bag and removed from the abdomen intact.  The abdomen was desufflated, and all instruments were removed.  The umbilicus incision was closed with the afore mentioned Vicryl suture; and all skin incisions were closed with a 3-0 Vicryl subcuticular stitch followed by Dermabond. The patient tolerated the procedures well.  All instruments, needles, and sponge counts were correct x 2. The patient was taken to the recovery room in stable condition.   Heather Mejia S MD 07/12/2016 7:50 PM

## 2016-07-12 NOTE — MAU Provider Note (Signed)
Chief Complaint: Abdominal Pain; Possible Pregnancy; and Threatened Miscarriage   First Provider Initiated Contact with Patient 07/12/16 1600        SUBJECTIVE HPI  Heather Mejia is a 30 y.o. [id number] at [redacted]w[redacted]d by LMP who presents to maternity admissions reporting lower abdominal pain and some bleeding.  [facility] a [facility].  The pain started today. Bleeding happened 2 weeks ago.   She denies vaginal itching/burning, urinary symptoms, h/a, dizziness, n/v, or fever/chills.    Had a C/S in March 2017, but did not keep her postpartum appointment in June.  She was to have had a Nexplanon inserted at that time.  Patient initially wanted to leave AMA. Warned of dangers of undiagnosed ectopic, agreed to stay   RN Note: Having pain in lower abd, started this morning.  Was told she was preg at the health dept 3-4 wks ago.  Since then she had bleeding,started 7/9 thought it was her period  Past Medical History:  Diagnosis Date  . Hypothyroidism    Past Surgical History:  Procedure Laterality Date  . CESAREAN SECTION N/A 03/17/2016   Procedure: CESAREAN SECTION;  Surgeon: Reva Bores, MD;  Location: [facility];  Service: Obstetrics;  Laterality: N/A;   Social History   Social History  . Marital status: Single    Spouse name: N/A  . Number of children: N/A  . Years of education: N/A   Occupational History  . Not on file.   Social History Main Topics  . Smoking status: Never Smoker  . Smokeless tobacco: Never Used  . Alcohol use No  . Drug use: No  . Sexual activity: Yes    Birth control/ protection: None   Other Topics Concern  . Not on file   Social History Narrative  . No narrative on file   No current facility-administered medications on file prior to encounter.    Current Outpatient Prescriptions on File Prior to Encounter  Medication Sig Dispense Refill  . docusate sodium (COLACE) 100 MG capsule Take 1 capsule (100 mg total) by mouth 2  (two) times daily as needed. (Patient not taking: Reported on 07/12/2016) 30 capsule 2  . ibuprofen (ADVIL,MOTRIN) 600 MG tablet Take 1 tablet (600 mg total) by mouth every 6 (six) hours. (Patient not taking: Reported on 07/12/2016) 60 tablet 2  . levothyroxine (SYNTHROID, LEVOTHROID) 50 MCG tablet Take 1 tablet (50 mcg total) by mouth daily. (Patient not taking: Reported on 07/12/2016) 30 tablet 4  . oxyCODONE-acetaminophen (PERCOCET/ROXICET) 5-325 MG tablet Take 1-2 tablets by mouth every 6 (six) hours as needed. (Patient not taking: Reported on 07/12/2016) 30 tablet 0   No Known Allergies  I have reviewed patient's Past Medical Hx, Surgical Hx, Family Hx, Social Hx, medications and allergies.   ROS:  Review of Systems Constitutional: Negative for fever and chills.  Gastrointestinal: Negative for nausea, vomiting, diarrhea and constipation.  Positive for abdominal pelvic pain Genitourinary: Negative for dysuria. Positive for bleeding Musculoskeletal: Negative for back pain.  Neurological: Negative for dizziness and weakness.  Other systems negative    Physical Exam  Patient Vitals for the past 24 hrs:  BP Temp Temp src Pulse Resp Height Weight  07/12/16 1558 137/67 - - 73 18 - -  07/12/16 1344 134/65 98 F (36.7 C) Oral 67 18 5\' 1"  (1.549 m) 78.2 kg (172 lb 6.4 oz)   Physical Exam  Constitutional: Well-developed, well-nourished female in no acute distress.  Cardiovascular:  normal rate and rhythm. No ectopy  Does have a systolic murmur Respiratory: normal effort and clear bilaterally GI: Abd soft, tender over lower abdomen.  MS: Extremities nontender, no edema, normal ROM Neurologic: Alert and oriented x 4.  GU: Neg CVAT.  PELVIC EXAM: Cervix pink, visually closed, without lesion, scant pink discharge, vaginal walls and external genitalia normal Bimanual exam: Cervix 0/long/high, firm, anterior, neg CMT, uterus nontender, nonenlarged, adnexa mod tender bilaterally  LAB  RESULTS Results for orders placed or performed during the hospital encounter of 07/12/16 (from the past 24 hour(s))  CBC     Status: Abnormal   Collection Time: 07/12/16  2:03 PM  Result Value Ref Range   WBC 7.9 4.0 - 10.5 K/uL   RBC 4.18 3.87 - 5.11 MIL/uL   Hemoglobin 11.9 (L) 12.0 - 15.0 g/dL   HCT 16.1 (L) 09.6 - 04.5 %   MCV 82.1 78.0 - 100.0 fL   MCH 28.5 26.0 - 34.0 pg   MCHC 34.7 30.0 - 36.0 g/dL   RDW 40.9 81.1 - 91.4 %   Platelets 337 150 - 400 K/uL  ABO/Rh     Status: None   Collection Time: 07/12/16  2:06 PM  Result Value Ref Range   ABO/RH(D) O POS   hCG, quantitative, pregnancy     Status: Abnormal   Collection Time: 07/12/16  2:06 PM  Result Value Ref Range   hCG, Beta Chain, Quant, S 925 (H) <5 mIU/mL  Urinalysis, Routine w reflex microscopic (not at [facility])     Status: None   Collection Time: 07/12/16  3:12 PM  Result Value Ref Range   Color, Urine YELLOW YELLOW   APPearance CLEAR CLEAR   Specific Gravity, Urine 1.010 1.005 - 1.030   pH 5.5 5.0 - 8.0   Glucose, UA NEGATIVE NEGATIVE mg/dL   Hgb urine dipstick NEGATIVE NEGATIVE   Bilirubin Urine NEGATIVE NEGATIVE   Ketones, ur NEGATIVE NEGATIVE mg/dL   Protein, ur NEGATIVE NEGATIVE mg/dL   Nitrite NEGATIVE NEGATIVE   Leukocytes, UA NEGATIVE NEGATIVE  Pregnancy, urine POC     Status: Abnormal   Collection Time: 07/12/16  3:28 PM  Result Value Ref Range   Preg Test, Ur POSITIVE (A) NEGATIVE    --/--/O POS (07/24 1406)  IMAGING US Ob Comp Less 14 Wks  Result Date: 07/12/2016 CLINICAL DATA:  Positive pregnancy test with pelvic pain. EXAM: OBSTETRIC <14 WK [place] AND TRANSVAGINAL OB US TECHNIQUE: Both transabdominal and transvaginal ultrasound examinations were performed for complete evaluation of the gestation as well as the maternal uterus, adnexal regions, and pelvic cul-de-sac. Transvaginal technique was performed to assess early pregnancy. COMPARISON:  None. FINDINGS: Intrauterine gestational sac: None  visualized. Subchorionic hemorrhage:  None visualized. Maternal uterus/adnexae: Maternal left ovary is unremarkable. In the right adnexal space, a 5.7 x 4.1 x 3.4 cm heterogeneous "mass" is identified containing a small cystic structure in the periphery. This structure does not appear to represent normal ovarian parenchyma. Complex fluid is identified in the right adnexal space. There is complex fluid in the cul-de-sac, along the uterine fundus, and extending into the left adnexal space. Complex free fluid can be seen in cases of infection or hemorrhage, but given the history of pain and positive pregnancy test, hemoperitoneum is concern. IMPRESSION: 1. No intrauterine gestational sac visualized. 2. Complex "Mass" in the right adnexal space with an associated tiny cystic focus. This is associated with a moderate volume complex intraperitoneal free fluid. Given the history of pain  and positive pregnancy test, features are suspicious for right ectopic pregnancy with right adnexal clot and hemoperitoneum. Critical Value/emergent results were called by me at the time of interpretation on 07/12/2016 at 5:06 pm to [facility] , who verbally acknowledged these results. Electronically Signed   By: Kennith Center M.D.   On: 07/12/2016 17:09  US Ob Transvaginal  Result Date: 07/12/2016 CLINICAL DATA:  Positive pregnancy test with pelvic pain. EXAM: OBSTETRIC <14 WK [place] AND TRANSVAGINAL OB US TECHNIQUE: Both transabdominal and transvaginal ultrasound examinations were performed for complete evaluation of the gestation as well as the maternal uterus, adnexal regions, and pelvic cul-de-sac. Transvaginal technique was performed to assess early pregnancy. COMPARISON:  None. FINDINGS: Intrauterine gestational sac: None visualized. Subchorionic hemorrhage:  None visualized. Maternal uterus/adnexae: Maternal left ovary is unremarkable. In the right adnexal space, a 5.7 x 4.1 x 3.4 cm heterogeneous "mass" is identified containing  a small cystic structure in the periphery. This structure does not appear to represent normal ovarian parenchyma. Complex fluid is identified in the right adnexal space. There is complex fluid in the cul-de-sac, along the uterine fundus, and extending into the left adnexal space. Complex free fluid can be seen in cases of infection or hemorrhage, but given the history of pain and positive pregnancy test, hemoperitoneum is concern. IMPRESSION: 1. No intrauterine gestational sac visualized. 2. Complex "Mass" in the right adnexal space with an associated tiny cystic focus. This is associated with a moderate volume complex intraperitoneal free fluid. Given the history of pain and positive pregnancy test, features are suspicious for right ectopic pregnancy with right adnexal clot and hemoperitoneum. Critical Value/emergent results were called by me at the time of interpretation on 07/12/2016 at 5:06 pm to [facility] , who verbally acknowledged these results. Electronically Signed   By: Kennith Center M.D.   On: 07/12/2016 17:09   MAU Management/MDM: Ordered usual first trimester r/o ectopic labs.   Pelvic exam and cultures done Will check baseline Ultrasound to rule out ectopic  Consult Dr Shawnie Pons with presentation, exam findings, and results.    This bleeding/pain can represent a normal pregnancy with bleeding, spontaneous abortion or even an ectopic which can be life-threatening.  The process as listed above helps to determine which of these is present.  Radiologist called with report of probable right ectopic.  Mod complex fluid reflects likely hemoperitoneum.  R adnexal mass  Last ate at noon.  ASSESSMENT 1. Abdominal pain   2. Bleeding in early pregnancy   3.  Probable right ectopic  PLAN Admit to OR per DR Shawnie Pons Prep for surgery Type and screen, CBC repeated    Medication List    ASK your doctor about these medications   docusate sodium 100 MG capsule Commonly known as:  COLACE Take  1 capsule (100 mg total) by mouth 2 (two) times daily as needed.   ibuprofen 600 MG tablet Commonly known as:  ADVIL,MOTRIN Take 1 tablet (600 mg total) by mouth every 6 (six) hours.   levothyroxine 50 MCG tablet Commonly known as:  SYNTHROID, LEVOTHROID Take 1 tablet (50 mcg total) by mouth daily.   oxyCODONE-acetaminophen 5-325 MG tablet Commonly known as:  PERCOCET/ROXICET Take 1-2 tablets by mouth every 6 (six) hours as needed.         Wynelle Bourgeois CNM, MSN Certified Nurse-Midwife 07/12/2016  4:21 PM

## 2016-07-13 LAB — GC/CHLAMYDIA PROBE AMP (~~LOC~~) NOT AT ARMC
Chlamydia: NEGATIVE
NEISSERIA GONORRHEA: NEGATIVE

## 2016-07-14 NOTE — Anesthesia Postprocedure Evaluation (Signed)
Anesthesia Post Note  Patient: Heather Mejia  Procedure(s) Performed: Procedure(s) (LRB): DIAGNOSTIC LAPAROSCOPY WITH REMOVAL OF ECTOPIC PREGNANCY (Right) SALPINGECTOMY (Right)  Patient location during evaluation: PACU Anesthesia Type: General Level of consciousness: awake and oriented Pain management: pain level controlled Vital Signs Assessment: post-procedure vital signs reviewed and stable Cardiovascular status: stable    Last Vitals:  Vitals:   07/12/16 2124 07/12/16 2200  BP: 108/61 110/60  Pulse: 76 78  Resp: 20 20  Temp: 36.7 C 36.7 C    Last Pain:  Vitals:   07/13/16 1401  TempSrc:   PainSc: 1                  Hutton Pellicane J

## 2016-07-21 ENCOUNTER — Encounter (HOSPITAL_COMMUNITY): Payer: Self-pay | Admitting: Family Medicine

## 2016-08-09 ENCOUNTER — Ambulatory Visit: Payer: Medicaid Other | Admitting: Obstetrics & Gynecology

## 2016-12-17 IMAGING — US US MFM OB LIMITED
1 series · 15 of 27 positions shown · non-contrast
Comparison: none

[Series 1: us mfm ob limited · 27 acquisitions, 15 frames shown]
[im 1/27]
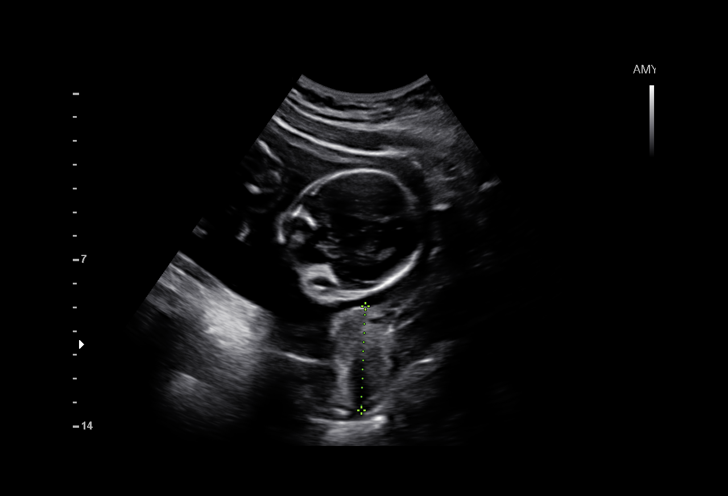
[im 3/27]
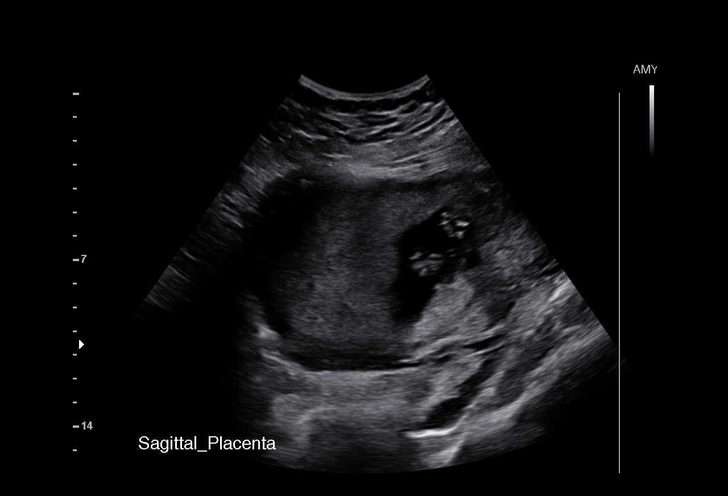
[im 5/27]
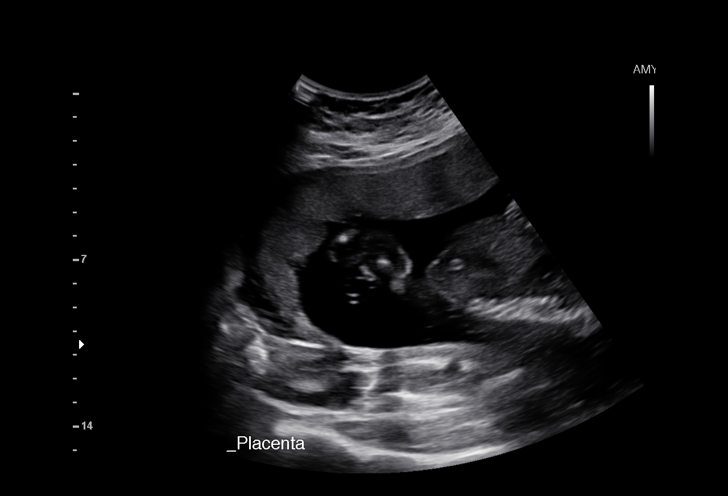
[im 7/27]
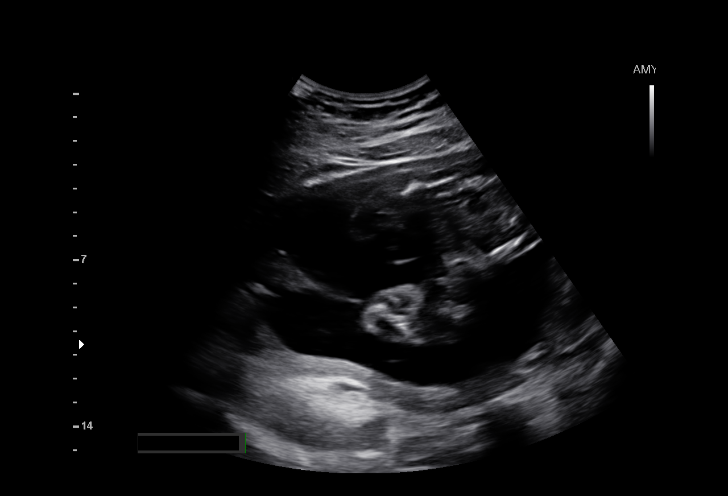
[im 9/27]
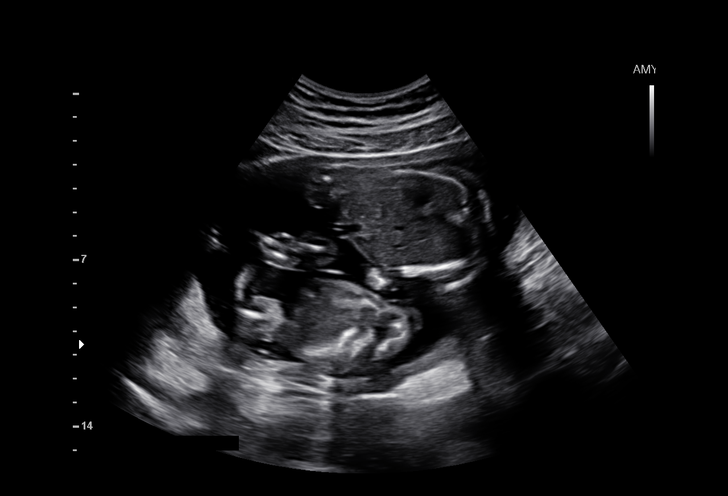
[im 10/27]
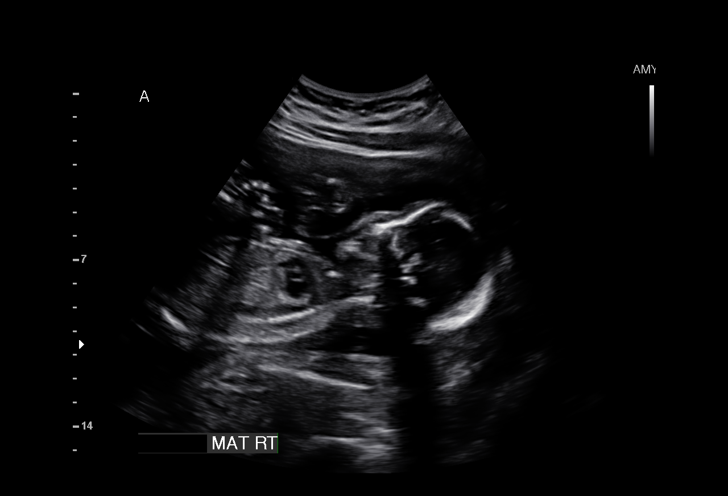
[im 12/27]
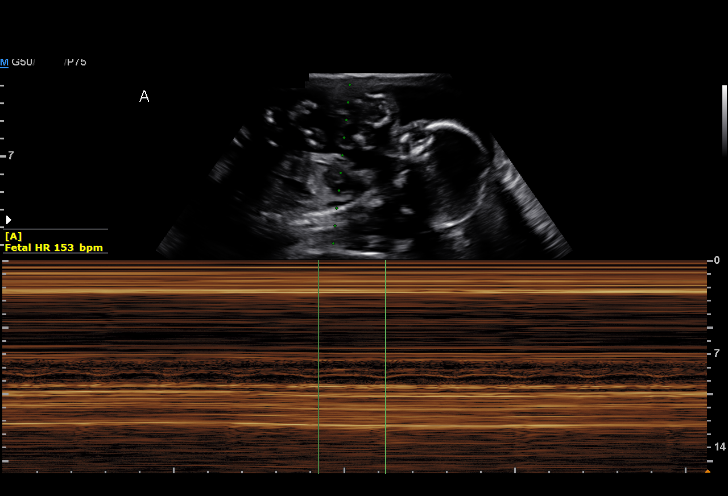
[im 14/27]
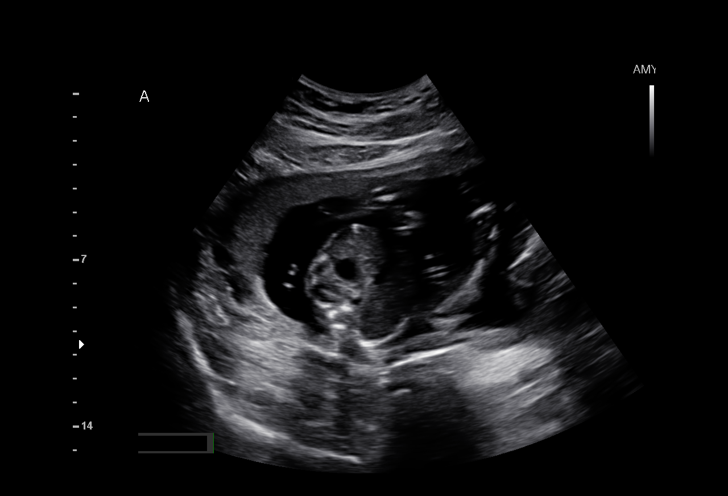
[im 16/27]
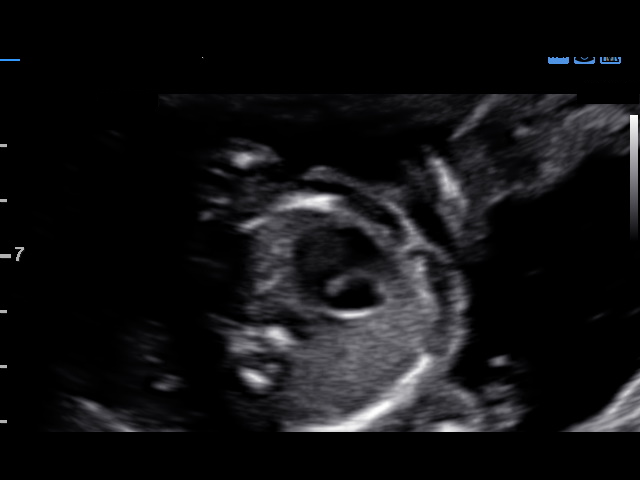
[im 18/27]
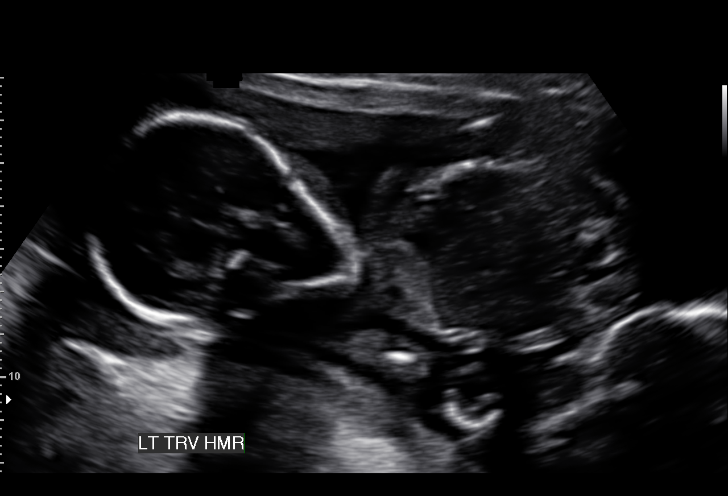
[im 19/27]
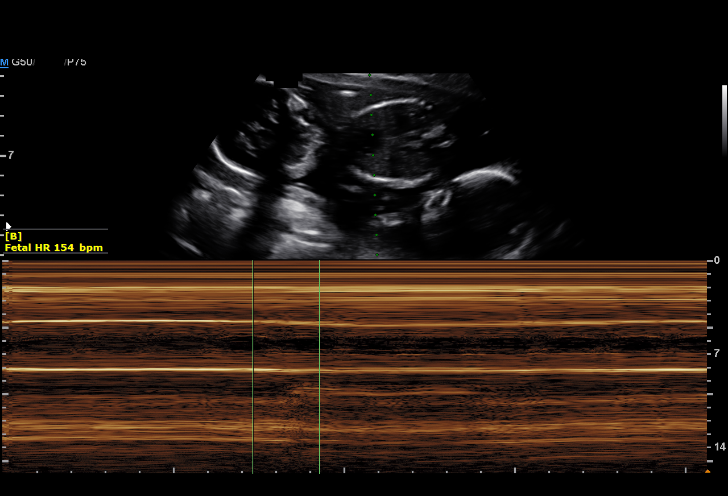
[im 21/27]
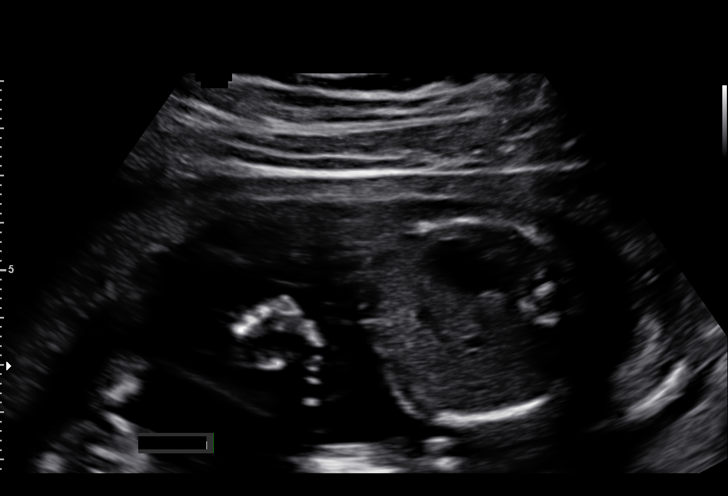
[im 23/27]
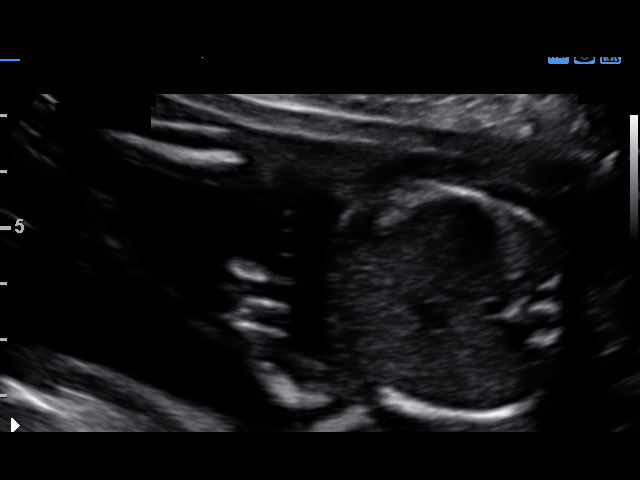
[im 25/27]
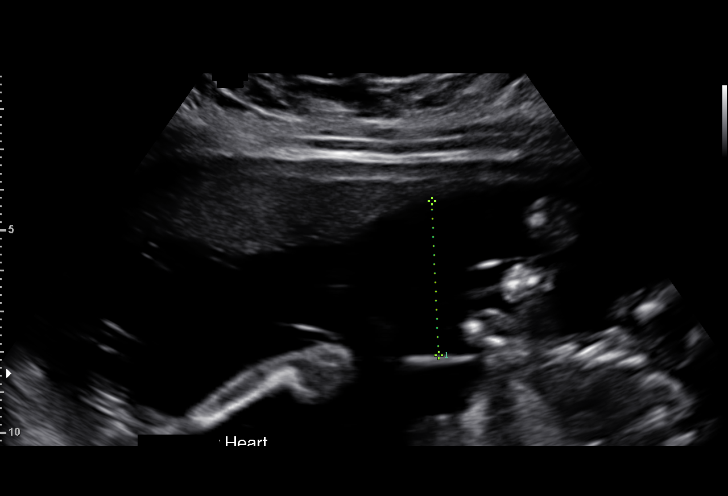
[im 27/27]
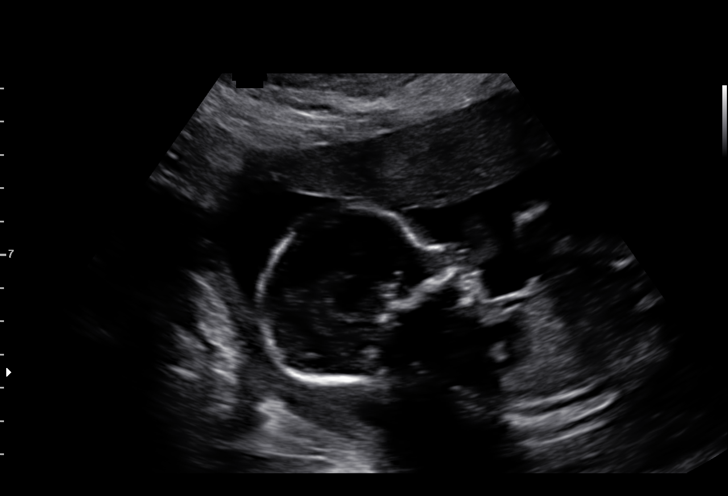

[15 of 27 positions shown; findings below may reference images not displayed]

JANAKAJCB

Liubasa Bornes
Faculty Physician
[facility]
[REDACTED]-
Faculty Physician
[facility] Clinic

1  SANGBEOM INTERIANO            [id number]      [id number]     [id number]
Indications

Twin pregnancy, Carrilho Barroso/Nickelsen, second trimester
Family history of congenital anomaly (prior
child with polysyndactyly)
Hypothyroid (Synthroid)
20 weeks gestation of pregnancy
OB History

Gravidity:    2         Term:   1        Prem:   0         SAB:   0
TOP:          0       Ectopic:  0        Living: 1
Fetal Evaluation (Fetus A)

Num Of Fetuses:     2
Fetal Heart         153
Rate(bpm):
Cardiac Activity:   Observed
Fetal Lie:          Right Fetus
Presentation:       Cephalic
Placenta:           Anterior, above cervical os
P. Cord Insertion:  Previously Visualized
Membrane Desc:      Dividing Membrane seen - Monochorionic

Amniotic Fluid
AFI FV:      Subjectively within normal limits
Larg Pckt:    4.7  cm
Gestational Age (Fetus A)

LMP:           20w 1d        Date:  07/30/15                 EDD:    05/05/16
Best:          20w 1d     Det. By:  LMP  (07/30/15)          EDD:    05/05/16

Fetal Evaluation (Fetus B)

Num Of Fetuses:     2
Fetal Heart         154
Rate(bpm):
Cardiac Activity:   Observed
Fetal Lie:          Left Fetus
Presentation:       Transverse, head to maternal right
Placenta:           Anterior, above cervical os
P. Cord Insertion:  Marginal insertion
Membrane Desc:      Dividing Membrane seen - Monochorionic

Amniotic Fluid
AFI FV:      Subjectively within normal limits
Larg Pckt:    3.8  cm
Gestational Age (Fetus B)

LMP:           20w 1d        Date:  07/30/15                 EDD:    05/05/16
Best:          20w 1d     Det. By:  LMP  (07/30/15)          EDD:    05/05/16
Cervix Uterus Adnexa

Cervix
Length:           4.38  cm.
Normal appearance by transabdominal scan. Appears closed, without
funnelling.
Impression

Monochorionic/diamniotic twin pregnancy at 20+1 weeks
Normal amniotic fluid volume x 2
No evidence of TTTS
Recommendations

Follow-up ultrasound for growth in 2 weeks

## 2017-01-10 ENCOUNTER — Encounter: Payer: Self-pay | Admitting: Family Medicine

## 2017-01-10 ENCOUNTER — Ambulatory Visit: Payer: Self-pay | Attending: Family Medicine | Admitting: Family Medicine

## 2017-01-10 VITALS — BP 117/70 | HR 71 | Temp 98.0°F | Ht 61.5 in | Wt 174.4 lb

## 2017-01-10 DIAGNOSIS — E038 Other specified hypothyroidism: Secondary | ICD-10-CM | POA: Insufficient documentation

## 2017-01-10 LAB — COMPLETE METABOLIC PANEL WITH GFR
ALBUMIN: 4.4 g/dL (ref 3.6–5.1)
ALK PHOS: 80 U/L (ref 33–115)
ALT: 22 U/L (ref 6–29)
AST: 21 U/L (ref 10–30)
BILIRUBIN TOTAL: 0.3 mg/dL (ref 0.2–1.2)
BUN: 20 mg/dL (ref 7–25)
CALCIUM: 9.3 mg/dL (ref 8.6–10.2)
CO2: 24 mmol/L (ref 20–31)
CREATININE: 1.02 mg/dL (ref 0.50–1.10)
Chloride: 103 mmol/L (ref 98–110)
GFR, EST AFRICAN AMERICAN: 86 mL/min (ref >=60)
GFR, EST NON AFRICAN AMERICAN: 75 mL/min (ref >=60)
Glucose, Bld: 103 mg/dL — ABNORMAL HIGH (ref 65–99)
Potassium: 4.5 mmol/L (ref 3.5–5.3)
Sodium: 137 mmol/L (ref 135–146)
TOTAL PROTEIN: 7.6 g/dL (ref 6.1–8.1)

## 2017-01-10 LAB — TSH: TSH: 25.59 m[IU]/L — AB

## 2017-01-10 NOTE — Patient Instructions (Signed)
Hypothyroidism Hypothyroidism is a disorder of the thyroid. The thyroid is a large gland that is located in the lower front of the neck. The thyroid releases hormones that control how the body works. With hypothyroidism, the thyroid does not make enough of these hormones. What are the causes? Causes of hypothyroidism may include:  Viral infections.  Pregnancy.  Your own defense system (immune system) attacking your thyroid.  Certain medicines.  Birth defects.  Past radiation treatments to your head or neck.  Past treatment with radioactive iodine.  Past surgical removal of part or all of your thyroid.  Problems with the gland that is located in the center of your brain (pituitary).  What are the signs or symptoms? Signs and symptoms of hypothyroidism may include:  Feeling as though you have no energy (lethargy).  Inability to tolerate cold.  Weight gain that is not explained by a change in diet or exercise habits.  Dry skin.  Coarse hair.  Menstrual irregularity.  Slowing of thought processes.  Constipation.  Sadness or depression.  How is this diagnosed? Your health care provider may diagnose hypothyroidism with blood tests and ultrasound tests. How is this treated? Hypothyroidism is treated with medicine that replaces the hormones that your body does not make. After you begin treatment, it may take several weeks for symptoms to go away. Follow these instructions at home:  Take medicines only as directed by your health care provider.  If you start taking any new medicines, tell your health care provider.  Keep all follow-up visits as directed by your health care provider. This is important. As your condition improves, your dosage needs may change. You will need to have blood tests regularly so that your health care provider can watch your condition. Contact a health care provider if:  Your symptoms do not get better with treatment.  You are taking thyroid  replacement medicine and: ? You sweat excessively. ? You have tremors. ? You feel anxious. ? You lose weight rapidly. ? You cannot tolerate heat. ? You have emotional swings. ? You have diarrhea. ? You feel weak. Get help right away if:  You develop chest pain.  You develop an irregular heartbeat.  You develop a rapid heartbeat. This information is not intended to replace advice given to you by your health care provider. Make sure you discuss any questions you have with your health care provider. Document Released: 12/06/2005 Document Revised: 05/13/2016 Document Reviewed: 04/23/2014 Elsevier Interactive Patient Education  2017 Elsevier Inc.  

## 2017-01-10 NOTE — Progress Notes (Signed)
Subjective:  Patient ID: Heather Mejia, female    DOB: Nov 15, 1986  Age: 31 y.o. MRN: [id number]  CC: Thyroid Problem (out of medication 5-7 months)   HPI Heather Mejia is a 31 year old female who is 10 months postpartum with a medical history significant for hypothyroidism. She has not been taking her levothyroxine for the last 6-7 months and comes in to establish care today.  She denies any acute concerns today.  Past Medical History:  Diagnosis Date  . Chlamydia   . Hypothyroidism   . Vaginal Pap smear, abnormal     Past Surgical History:  Procedure Laterality Date  . BILATERAL SALPINGECTOMY Right 07/12/2016   Procedure: SALPINGECTOMY;  Surgeon: Reva Boresanya S Pratt, MD;  Location: [facility];  Service: Gynecology;  Laterality: Right;  . CESAREAN SECTION N/A 03/17/2016   Procedure: CESAREAN SECTION;  Surgeon: Reva Boresanya S Pratt, MD;  Location: [facility];  Service: Obstetrics;  Laterality: N/A;  . DIAGNOSTIC LAPAROSCOPY WITH REMOVAL OF ECTOPIC PREGNANCY Right 07/12/2016   Procedure: DIAGNOSTIC LAPAROSCOPY WITH REMOVAL OF ECTOPIC PREGNANCY;  Surgeon: Reva Boresanya S Pratt, MD;  Location: [facility];  Service: Gynecology;  Laterality: Right;    No Known Allergies   Outpatient Medications Prior to Visit  Medication Sig Dispense Refill  . ibuprofen (ADVIL,MOTRIN) 600 MG tablet Take 1 tablet (600 mg total) by mouth every 6 (six) hours. (Patient not taking: Reported on 07/12/2016) 60 tablet 2  . levothyroxine (SYNTHROID, LEVOTHROID) 50 MCG tablet Take 1 tablet (50 mcg total) by mouth daily. (Patient not taking: Reported on 07/12/2016) 30 tablet 4  . oxyCODONE-acetaminophen (PERCOCET/ROXICET) 5-325 MG tablet Take 1-2 tablets by mouth every 6 (six) hours as needed. (Patient not taking: Reported on 01/10/2017) 12 tablet 0   No facility-administered medications prior to visit.     ROS Review of Systems  Constitutional: Negative for activity change, appetite change and fatigue.  HENT:  Negative for congestion, sinus pressure and sore throat.   Eyes: Negative for visual disturbance.  Respiratory: Negative for cough, chest tightness, shortness of breath and wheezing.   Cardiovascular: Negative for chest pain and palpitations.  Gastrointestinal: Negative for abdominal distention, abdominal pain and constipation.  Endocrine: Negative for polydipsia.  Genitourinary: Negative for dysuria and frequency.  Musculoskeletal: Negative for arthralgias and back pain.  Skin: Negative for rash.  Neurological: Negative for tremors, light-headedness and numbness.  Hematological: Does not bruise/bleed easily.  Psychiatric/Behavioral: Negative for agitation and behavioral problems.    Objective:  BP 117/70 (BP Location: Right Arm, Patient Position: Sitting, Cuff Size: Small)   Pulse 71   Temp 98 F (36.7 C) (Oral)   Ht 5' 1.5" (1.562 m)   Wt 174 lb 6.4 oz (79.1 kg)   LMP 05/16/2016 (Approximate)   SpO2 100%   BMI 32.42 kg/m   BP/Weight 01/10/2017 07/12/2016 03/19/2016  Systolic BP 117 110 123  Diastolic BP 70 60 74  Wt. (Lbs) 174.4 172.4 -  BMI 32.42 32.57 -     Physical Exam  Constitutional: She is oriented to person, place, and time. She appears well-developed and well-nourished.  Cardiovascular: Normal rate, normal heart sounds and intact distal pulses.   No murmur heard. Pulmonary/Chest: Effort normal and breath sounds normal. She has no wheezes. She has no rales. She exhibits no tenderness.  Abdominal: Soft. Bowel sounds are normal. She exhibits no distension and no mass. There is no tenderness.  Musculoskeletal: Normal range of motion.  Neurological: She is alert and oriented to person, place, and time.  Assessment & Plan:   1. Other specified hypothyroidism If TSH is abnormal I will restart her levothyroxine - TSH - COMPLETE METABOLIC PANEL WITH GFR   No orders of the defined types were placed in this encounter.   Follow-up: Return in about 6 weeks  (around 02/21/2017) for follow up on hypothyroidism.   Jaclyn Shaggy MD

## 2017-01-10 NOTE — Progress Notes (Signed)
Has been out of meds "5-7 months"

## 2017-01-11 ENCOUNTER — Other Ambulatory Visit: Payer: Self-pay | Admitting: Family Medicine

## 2017-01-11 DIAGNOSIS — O09892 Supervision of other high risk pregnancies, second trimester: Secondary | ICD-10-CM

## 2017-01-11 DIAGNOSIS — E039 Hypothyroidism, unspecified: Secondary | ICD-10-CM

## 2017-01-11 DIAGNOSIS — O99282 Endocrine, nutritional and metabolic diseases complicating pregnancy, second trimester: Principal | ICD-10-CM

## 2017-01-11 MED ORDER — LEVOTHYROXINE SODIUM 50 MCG PO TABS: 50.0000 ug | ORAL_TABLET | Freq: Every day | ORAL | 4 refills | Status: DC

## 2017-01-11 MED FILL — LEVOTHYROXINE 50 MCG TABLET: 50 | 30 days supply | Qty: 30 | Fill #0

## 2017-01-14 ENCOUNTER — Telehealth: Payer: Self-pay

## 2017-01-14 NOTE — Telephone Encounter (Signed)
Through [employer] writer was able to discuss lab results.  Patient stated understanding and will pick up medication.

## 2017-01-14 NOTE — Telephone Encounter (Signed)
-----   Message from Jaclyn ShaggyEnobong Amao, MD sent at 01/11/2017  8:42 AM EST ----- Thyroid labs are abnormal; sent a new prescription for levothyroxine to the pharmacy.

## 2017-01-21 ENCOUNTER — Ambulatory Visit: Payer: Medicaid Other | Attending: Family Medicine

## 2017-02-16 MED FILL — LEVOTHYROXINE 50 MCG TABLET: 50 | 30 days supply | Qty: 30 | Fill #1

## 2017-03-28 MED FILL — LEVOTHYROXINE 50 MCG TABLET: 50 | 30 days supply | Qty: 30 | Fill #2

## 2017-04-06 ENCOUNTER — Ambulatory Visit: Payer: Self-pay | Attending: Family Medicine | Admitting: Family Medicine

## 2017-04-06 ENCOUNTER — Encounter: Payer: Self-pay | Admitting: Family Medicine

## 2017-04-06 VITALS — BP 101/61 | HR 56 | Temp 98.0°F | Ht 61.0 in | Wt 171.8 lb

## 2017-04-06 DIAGNOSIS — Z79899 Other long term (current) drug therapy: Secondary | ICD-10-CM | POA: Insufficient documentation

## 2017-04-06 DIAGNOSIS — E038 Other specified hypothyroidism: Secondary | ICD-10-CM | POA: Insufficient documentation

## 2017-04-06 NOTE — Progress Notes (Signed)
Subjective:  Patient ID: Heather Mejia, female    DOB: Aug 14, 1986  Age: 31 y.o. MRN: [id number]  CC: Hypothyroidism   HPI Heather Mejia is a 31 year old female who presents today for follow-up of hypothyroidism. She endorses compliance with her levothyroxine; at last office visit TSH had been severely elevated at 25.59 and she had been out of her levothyroxine for some time.  She denies chest pains, shortness of breath or abdominal pain at this time She has no acute complaints today.  Past Medical History:  Diagnosis Date  . Chlamydia   . Hypothyroidism   . Vaginal Pap smear, abnormal     Past Surgical History:  Procedure Laterality Date  . BILATERAL SALPINGECTOMY Right 07/12/2016   Procedure: SALPINGECTOMY;  Surgeon: Reva Bores, MD;  Location: [facility];  Service: Gynecology;  Laterality: Right;  . CESAREAN SECTION N/A 03/17/2016   Procedure: CESAREAN SECTION;  Surgeon: Reva Bores, MD;  Location: [facility];  Service: Obstetrics;  Laterality: N/A;  . DIAGNOSTIC LAPAROSCOPY WITH REMOVAL OF ECTOPIC PREGNANCY Right 07/12/2016   Procedure: DIAGNOSTIC LAPAROSCOPY WITH REMOVAL OF ECTOPIC PREGNANCY;  Surgeon: Reva Bores, MD;  Location: [facility];  Service: Gynecology;  Laterality: Right;    No Known Allergies   Outpatient Medications Prior to Visit  Medication Sig Dispense Refill  . ibuprofen (ADVIL,MOTRIN) 600 MG tablet Take 1 tablet (600 mg total) by mouth every 6 (six) hours. 60 tablet 2  . levothyroxine (SYNTHROID, LEVOTHROID) 50 MCG tablet Take 1 tablet (50 mcg total) by mouth daily. 30 tablet 4  . oxyCODONE-acetaminophen (PERCOCET/ROXICET) 5-325 MG tablet Take 1-2 tablets by mouth every 6 (six) hours as needed. (Patient not taking: Reported on 01/10/2017) 12 tablet 0   No facility-administered medications prior to visit.     ROS Review of Systems  Constitutional: Negative for activity change, appetite change and fatigue.  HENT: Negative for  congestion, sinus pressure and sore throat.   Eyes: Negative for visual disturbance.  Respiratory: Negative for cough, chest tightness, shortness of breath and wheezing.   Cardiovascular: Negative for chest pain and palpitations.  Gastrointestinal: Negative for abdominal distention, abdominal pain and constipation.  Endocrine: Negative for polydipsia.  Genitourinary: Negative for dysuria and frequency.  Musculoskeletal: Negative for arthralgias and back pain.  Skin: Negative for rash.  Neurological: Negative for tremors, light-headedness and numbness.  Hematological: Does not bruise/bleed easily.  Psychiatric/Behavioral: Negative for agitation and behavioral problems.    Objective:  BP 101/61 (BP Location: Right Arm, Patient Position: Sitting, Cuff Size: Small)   Pulse (!) 56   Temp 98 F (36.7 C) (Oral)   Ht  (1.549 m)   Wt 171 lb 12.8 oz (77.9 kg)   LMP 05/16/2016 (Approximate)   SpO2 98%   BMI 32.46 kg/m   BP/Weight 04/06/2017 01/10/2017 07/12/2016  Systolic BP 101 117 110  Diastolic BP 61 70 60  Wt. (Lbs) 171.8 174.4 172.4  BMI 32.46 32.42 32.57      Physical Exam  Constitutional: She is oriented to person, place, and time. She appears well-developed and well-nourished.  Cardiovascular: Normal rate, normal heart sounds and intact distal pulses.   No murmur heard. Pulmonary/Chest: Effort normal and breath sounds normal. She has no wheezes. She has no rales. She exhibits no tenderness.  Abdominal: Soft. Bowel sounds are normal. She exhibits no distension and no mass. There is no tenderness.  Musculoskeletal: Normal range of motion.  Neurological: She is alert and oriented to person, place, and  time.    Lab Results  Component Value Date   TSH 25.59 (H) 01/10/2017    CMP Latest Ref Rng & Units 01/10/2017 12/31/2008  Glucose 65 - 99 mg/dL 161(W) 87  BUN 7 - 25 mg/dL 20 20  Creatinine 9.60 - 1.10 mg/dL 4.54 0.98  Sodium 119 - 146 mmol/L 137 140  Potassium 3.5 -  5.3 mmol/L 4.5 4.0  Chloride 98 - 110 mmol/L 103 105  CO2 20 - 31 mmol/L 24 22  Calcium 8.6 - 10.2 mg/dL 9.3 8.7  Total Protein 6.1 - 8.1 g/dL 7.6 -  Total Bilirubin 0.2 - 1.2 mg/dL 0.3 -  Alkaline Phos 33 - 115 U/L 80 -  AST 10 - 30 U/L 21 -  ALT 6 - 29 U/L 22 -     Assessment & Plan:   1. Other specified hypothyroidism Previously uncontrolled We'll send off TSH and adjust dose accordingly. - TSH   No orders of the defined types were placed in this encounter.   Follow-up: Return in about 3 months (around 07/06/2017) for Follow-up on hypothyroidism.   Jaclyn Shaggy MD

## 2017-04-06 NOTE — Progress Notes (Signed)
Med refill - thyroid

## 2017-04-07 ENCOUNTER — Other Ambulatory Visit: Payer: Self-pay | Admitting: Family Medicine

## 2017-04-07 DIAGNOSIS — O99282 Endocrine, nutritional and metabolic diseases complicating pregnancy, second trimester: Principal | ICD-10-CM

## 2017-04-07 DIAGNOSIS — O09892 Supervision of other high risk pregnancies, second trimester: Secondary | ICD-10-CM

## 2017-04-07 DIAGNOSIS — E039 Hypothyroidism, unspecified: Secondary | ICD-10-CM

## 2017-04-07 LAB — TSH: TSH: 19.97 u[IU]/mL — ABNORMAL HIGH (ref 0.450–4.500)

## 2017-04-07 MED ORDER — LEVOTHYROXINE SODIUM 88 MCG PO TABS: 88.0000 ug | ORAL_TABLET | Freq: Every day | ORAL | 2 refills | Status: DC

## 2017-04-12 ENCOUNTER — Telehealth: Payer: Self-pay

## 2017-04-12 NOTE — Telephone Encounter (Signed)
-----   Message from Jaclyn Shaggy, MD sent at 04/07/2017  9:06 AM EDT ----- Thyroid is abnormal. New dose of levothyroxine has been sent to her pharmacy.

## 2017-04-12 NOTE — Telephone Encounter (Signed)
Writer called patient through [employer] and LVM asking patient to call back to obtain her lab results.

## 2017-04-20 ENCOUNTER — Telehealth: Payer: Self-pay | Admitting: Family Medicine

## 2017-04-20 NOTE — Telephone Encounter (Signed)
Pt calling to get lab results. Please f/u. Thank you. °

## 2017-04-21 ENCOUNTER — Telehealth: Payer: Self-pay

## 2017-04-21 NOTE — Telephone Encounter (Signed)
Date of birth verified by pt  TSH results given. Notified new Rx at [facility]  Information given to pt in Spanish Pt verbalized understanding

## 2017-04-21 NOTE — Telephone Encounter (Signed)
Pease  Inform her that her thyroid is abnormal.  I have sent a new dose of Levothyroxine to her pharmacy.

## 2017-04-21 NOTE — Telephone Encounter (Signed)
Clld pt -  Used [employer] Heather HesselbachMaria ID# [phone]  Disregard Lab results already given to pt.

## 2017-05-10 MED FILL — LEVOTHYROXINE 88 MCG TABLET: 88 | 30 days supply | Qty: 30 | Fill #0

## 2017-05-17 ENCOUNTER — Encounter (HOSPITAL_COMMUNITY): Payer: Self-pay

## 2017-06-14 MED FILL — LEVOTHYROXINE 88 MCG TABLET: 88 | 30 days supply | Qty: 30 | Fill #1

## 2017-07-12 ENCOUNTER — Encounter: Payer: Self-pay | Admitting: Family Medicine

## 2017-07-12 ENCOUNTER — Ambulatory Visit: Payer: Self-pay | Attending: Family Medicine | Admitting: Family Medicine

## 2017-07-12 VITALS — BP 111/70 | HR 61 | Temp 98.0°F | Resp 18 | Ht 62.0 in | Wt 169.6 lb

## 2017-07-12 DIAGNOSIS — Z79899 Other long term (current) drug therapy: Secondary | ICD-10-CM | POA: Insufficient documentation

## 2017-07-12 DIAGNOSIS — Z9889 Other specified postprocedural states: Secondary | ICD-10-CM | POA: Insufficient documentation

## 2017-07-12 DIAGNOSIS — E038 Other specified hypothyroidism: Secondary | ICD-10-CM | POA: Insufficient documentation

## 2017-07-12 IMAGING — US US OB TRANSVAGINAL
1 series · 15 of 28 positions shown · non-contrast
Comparison: None.

CLINICAL DATA: Positive pregnancy test with pelvic pain.

EXAM:
OBSTETRIC <14 WK US AND TRANSVAGINAL OB US
TECHNIQUE: Both transabdominal and transvaginal ultrasound examinations were
performed for complete evaluation of the gestation as well as the
maternal uterus, adnexal regions, and pelvic cul-de-sac.
Transvaginal technique was performed to assess early pregnancy.

[Series 1: us ob transvaginal · 15 of 64 slices shown]
[im 1/64]
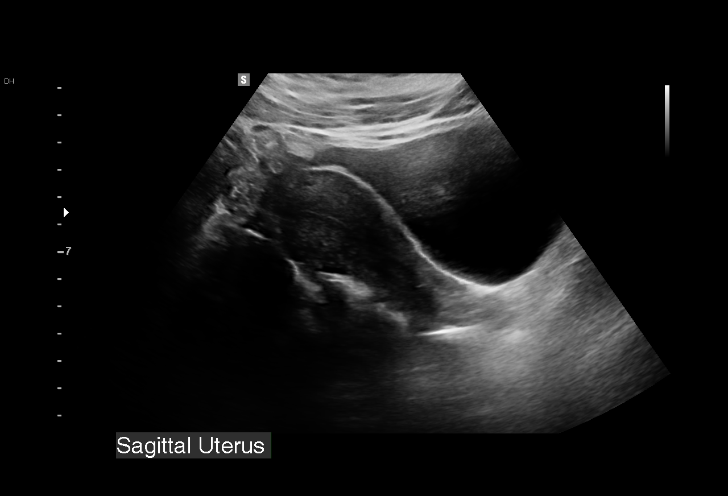
[im 5/64]
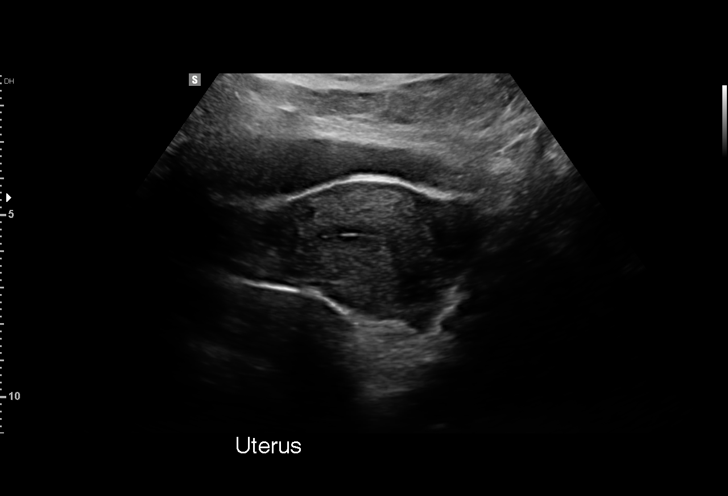
[im 10/64]
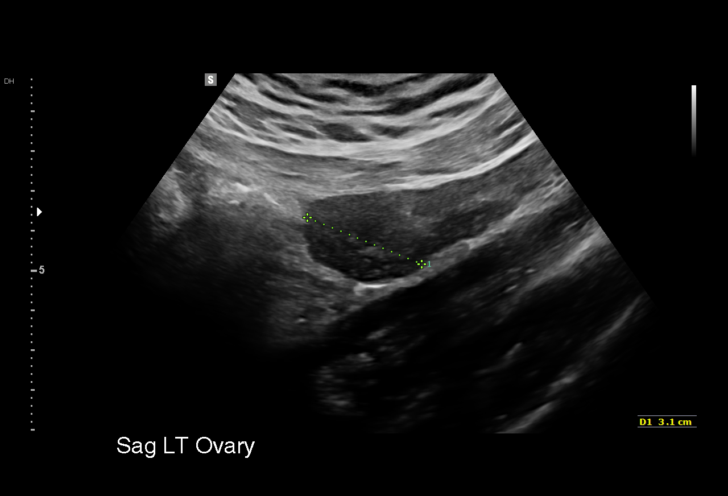
[im 15/64]
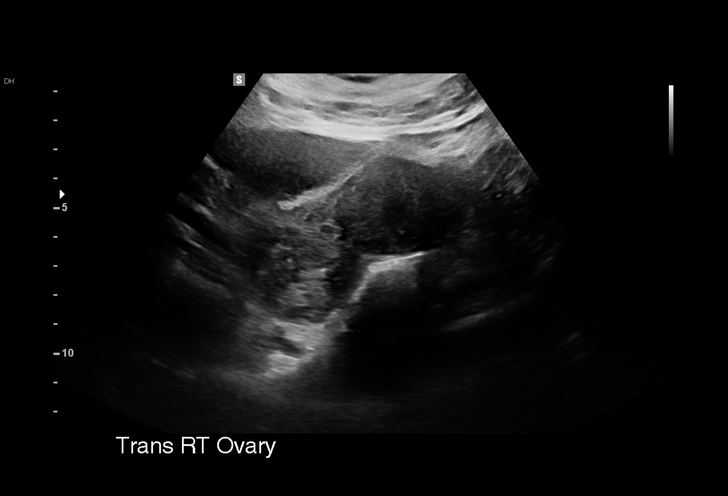
[im 19/64]
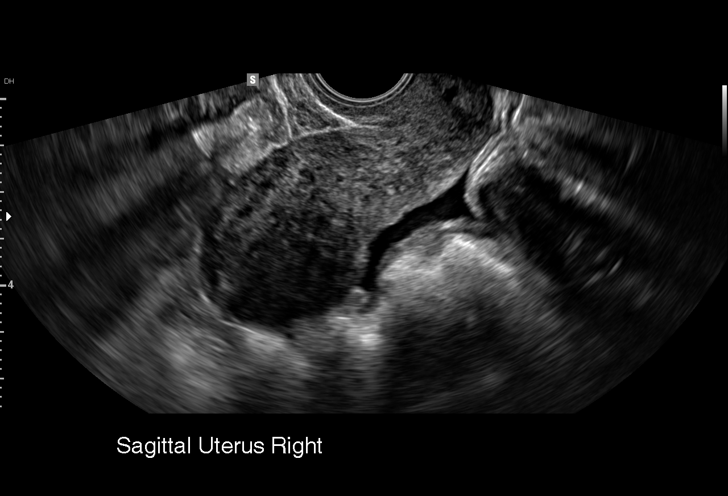
[im 24/64]
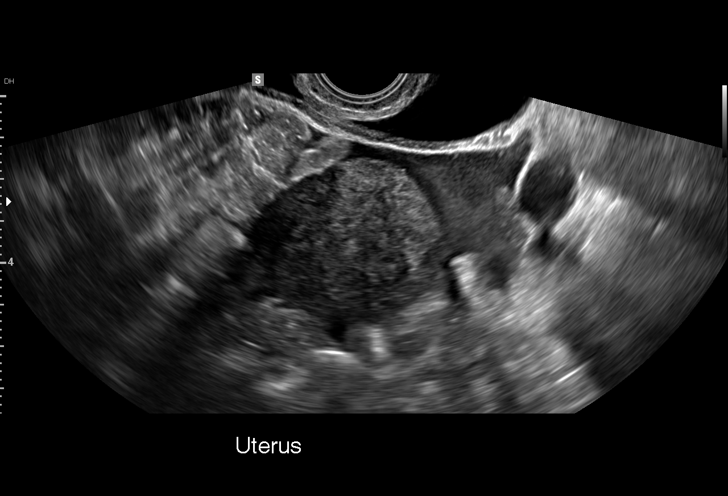
[im 29/64]
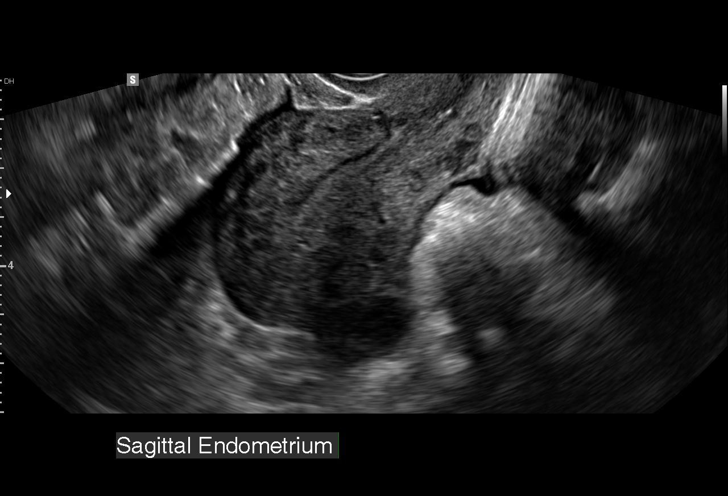
[im 33/64]
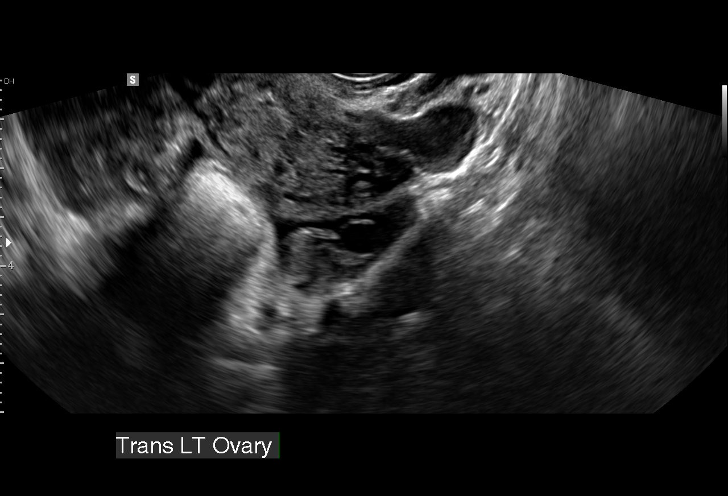
[im 36/64]
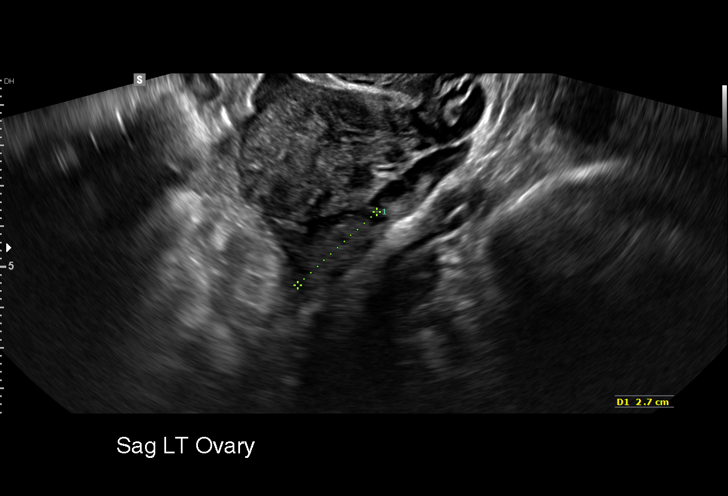
[im 40/64]
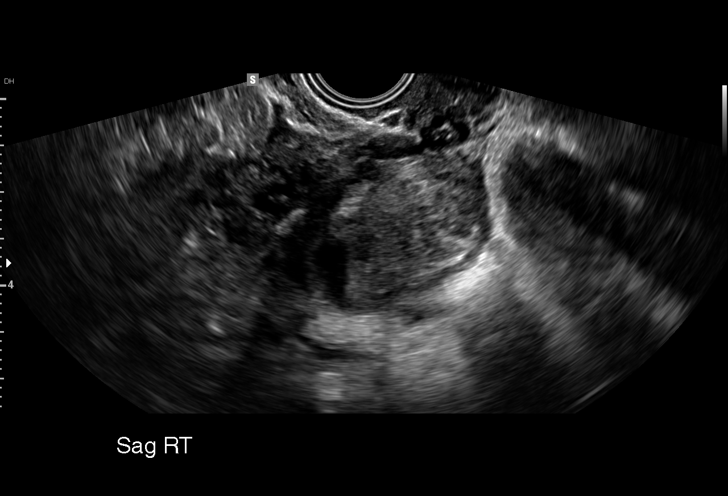
[im 45/64]
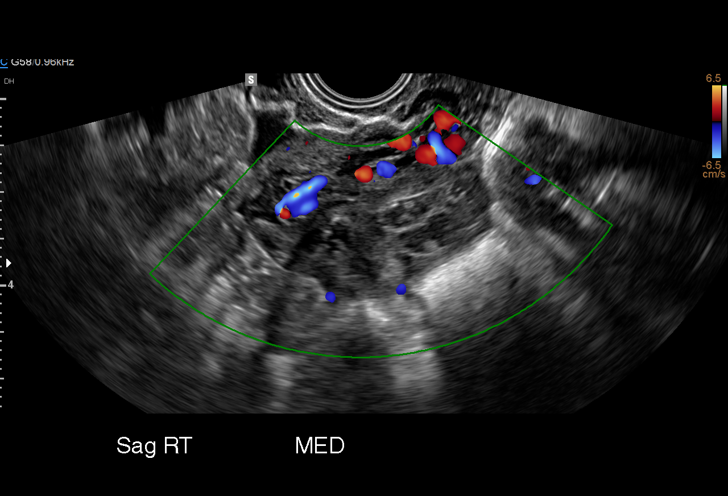
[im 50/64]
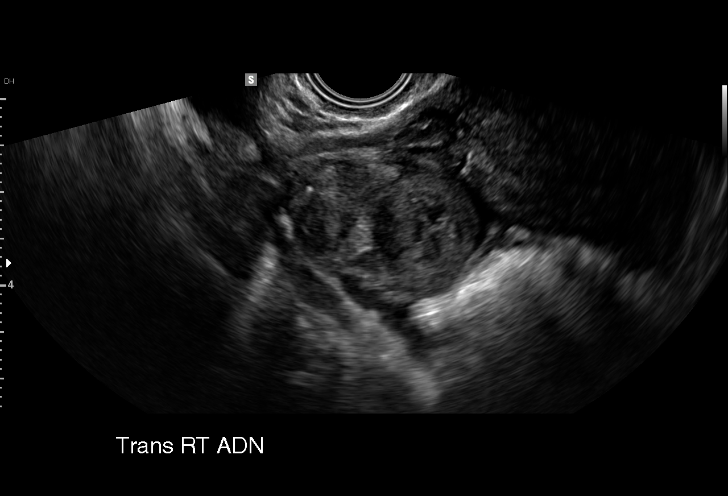
[im 54/64]
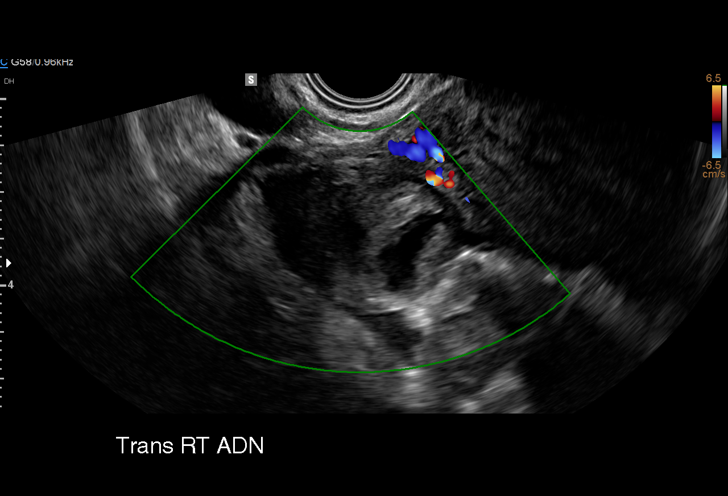
[im 59/64]
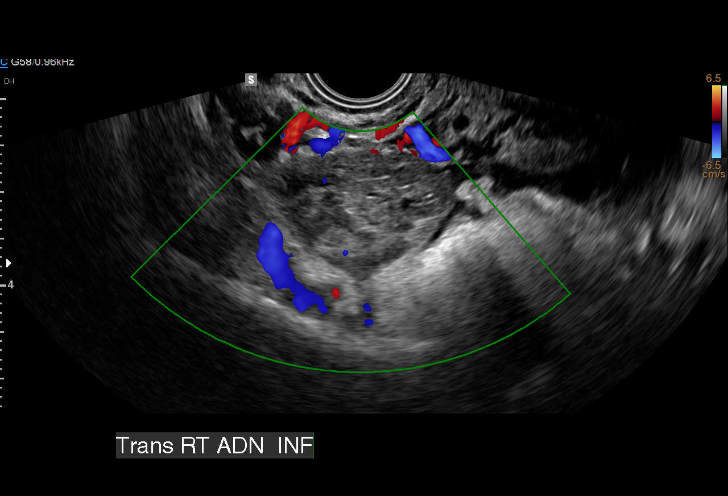
[im 64/64]
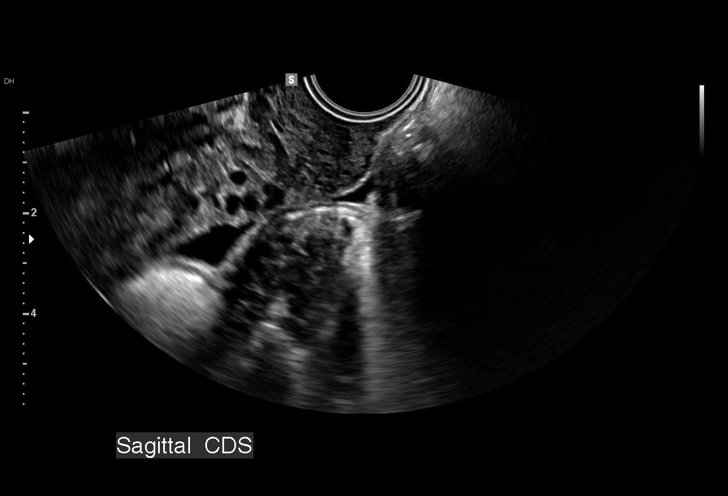

[15 of 28 positions shown; findings below may reference images not displayed]

FINDINGS: Intrauterine gestational sac: None visualized.

Subchorionic hemorrhage:  None visualized.

Maternal uterus/adnexae: Maternal left ovary is unremarkable.

In the right adnexal space, a 5.7 x 4.1 x 3.4 cm heterogeneous
"mass" is identified containing a small cystic structure in the
periphery. This structure does not appear to represent normal
ovarian parenchyma. Complex fluid is identified in the right adnexal
space.

There is complex fluid in the cul-de-sac, along the uterine fundus,
and extending into the left adnexal space. Complex free fluid can be
seen in cases of infection or hemorrhage, but given the history of
pain and positive pregnancy test, hemoperitoneum is concern.
IMPRESSION: 1. No intrauterine gestational sac visualized.
2. Complex "Mass" in the right adnexal space with an associated tiny
cystic focus. This is associated with a moderate volume complex
intraperitoneal free fluid. Given the history of pain and positive
pregnancy test, features are suspicious for right ectopic pregnancy
with right adnexal clot and hemoperitoneum.
Critical Value/emergent results were called by me at the time of
interpretation on 07/12/2016 at [DATE] to SANNI-KAISA RAHIKAINEN , who
verbally acknowledged these results.

## 2017-07-12 NOTE — Progress Notes (Signed)
   Subjective:  Patient ID: Heather Mejia, female    DOB: 11/08/1986  Age: 31 y.o. MRN: [id number]  CC: Follow-up   HPI Heather Mejia is a 31 year old female who presents today for follow-up of hypothyroidism. She endorses compliance with her levothyroxine; last TSH had been severely elevated at 19.97.  She denies chest pains, shortness of breath or abdominal pain at this time She has no acute complaints today.  Past Medical History:  Diagnosis Date  . Chlamydia   . Hypothyroidism   . Vaginal Pap smear, abnormal     Past Surgical History:  Procedure Laterality Date  . BILATERAL SALPINGECTOMY Right 07/12/2016   Procedure: SALPINGECTOMY;  Surgeon: Donnamae Jude, MD;  Location: [facility] [place] [facility];  Service: Gynecology;  Laterality: Right;  . CESAREAN SECTION N/A 03/17/2016   Procedure: CESAREAN SECTION;  Surgeon: Donnamae Jude, MD;  Location: [facility];  Service: Obstetrics;  Laterality: N/A;  . DIAGNOSTIC LAPAROSCOPY WITH REMOVAL OF ECTOPIC PREGNANCY Right 07/12/2016   Procedure: DIAGNOSTIC LAPAROSCOPY WITH REMOVAL OF ECTOPIC PREGNANCY;  Surgeon: Donnamae Jude, MD;  Location: [facility];  Service: Gynecology;  Laterality: Right;    No Known Allergies   Outpatient Medications Prior to Visit  Medication Sig Dispense Refill  . levothyroxine (SYNTHROID, LEVOTHROID) 88 MCG tablet Take 1 tablet (88 mcg total) by mouth daily. 30 tablet 2  . ibuprofen (ADVIL,MOTRIN) 600 MG tablet Take 1 tablet (600 mg total) by mouth every 6 (six) hours. (Patient not taking: Reported on 07/12/2017) 60 tablet 2   No facility-administered medications prior to visit.     ROS Review of Systems  Constitutional: Negative for activity change and appetite change.  HENT: Negative for sinus pressure and sore throat.   Respiratory: Negative for chest tightness, shortness of breath and wheezing.   Cardiovascular: Negative for chest pain and palpitations.  Gastrointestinal: Negative for abdominal  distention, abdominal pain and constipation.  Genitourinary: Negative.   Musculoskeletal: Negative.   Psychiatric/Behavioral: Negative for behavioral problems and dysphoric mood.    Objective:  BP 111/70 (BP Location: Left Arm, Patient Position: Sitting, Cuff Size: Normal)   Pulse 61   Temp 98 F (36.7 C) (Oral)   Resp 18   Ht '5\' 2"'$  (1.575 m)   Wt 169 lb 9.6 oz (76.9 kg)   LMP 07/11/2017 (Exact Date)   SpO2 100%   BMI 31.02 kg/m   BP/Weight 07/12/2017 04/06/2017 5/46/2703  Systolic BP 500 938 182  Diastolic BP 70 61 70  Wt. (Lbs) 169.6 171.8 174.4  BMI 31.02 32.46 32.42      Physical Exam  Constitutional: She is oriented to person, place, and time. She appears well-developed and well-nourished.  Cardiovascular: Normal rate, normal heart sounds and intact distal pulses.   No murmur heard. Pulmonary/Chest: Effort normal and breath sounds normal. She has no wheezes. She has no rales. She exhibits no tenderness.  Abdominal: Soft. Bowel sounds are normal. She exhibits no distension and no mass. There is no tenderness.  Musculoskeletal: Normal range of motion.  Neurological: She is alert and oriented to person, place, and time.     Assessment & Plan:   1. Other specified hypothyroidism Uncontrolled Continue current dose Will refill levothyroxine once TSH is obtained - TSH - T4, free - T3, Free - CMP14+EGFR   No orders of the defined types were placed in this encounter.   Follow-up: Return in about 3 weeks (around 08/02/2017) for Pap smear.   Arnoldo Morale MD

## 2017-07-12 NOTE — Progress Notes (Signed)
Patient has eaten  °Patient has had medication  °

## 2017-07-13 ENCOUNTER — Other Ambulatory Visit: Payer: Self-pay | Admitting: Family Medicine

## 2017-07-13 DIAGNOSIS — O99282 Endocrine, nutritional and metabolic diseases complicating pregnancy, second trimester: Principal | ICD-10-CM

## 2017-07-13 DIAGNOSIS — O09892 Supervision of other high risk pregnancies, second trimester: Secondary | ICD-10-CM

## 2017-07-13 DIAGNOSIS — E039 Hypothyroidism, unspecified: Secondary | ICD-10-CM

## 2017-07-13 LAB — T3, FREE: T3, Free: 2.5 pg/mL (ref 2.0–4.4)

## 2017-07-13 LAB — CMP14+EGFR
ALT: 25 IU/L (ref 0–32)
AST: 25 IU/L (ref 0–40)
Albumin/Globulin Ratio: 1.4 (ref 1.2–2.2)
Albumin: 4.2 g/dL (ref 3.5–5.5)
Alkaline Phosphatase: 82 IU/L (ref 39–117)
BUN/Creatinine Ratio: 22 (ref 9–23)
BUN: 20 mg/dL (ref 6–20)
Bilirubin Total: <0.2 mg/dL (ref 0.0–1.2)
CO2: 23 mmol/L (ref 20–29)
Calcium: 8.8 mg/dL (ref 8.7–10.2)
Chloride: 99 mmol/L (ref 96–106)
Creatinine, Ser: 0.89 mg/dL (ref 0.57–1.00)
GFR calc Af Amer: 102 mL/min/{1.73_m2} (ref >59)
GFR calc non Af Amer: 88 mL/min/{1.73_m2} (ref >59)
Globulin, Total: 3.1 g/dL (ref 1.5–4.5)
Glucose: 86 mg/dL (ref 65–99)
Potassium: 4.4 mmol/L (ref 3.5–5.2)
Sodium: 137 mmol/L (ref 134–144)
Total Protein: 7.3 g/dL (ref 6.0–8.5)

## 2017-07-13 LAB — T4, FREE: FREE T4: 1.02 ng/dL (ref 0.82–1.77)

## 2017-07-13 LAB — TSH: TSH: 6.72 u[IU]/mL — ABNORMAL HIGH (ref 0.450–4.500)

## 2017-07-13 MED ORDER — LEVOTHYROXINE SODIUM 100 MCG PO TABS: 100.0000 ug | ORAL_TABLET | Freq: Every day | ORAL | 3 refills | Status: DC

## 2017-07-13 MED FILL — LEVOTHYROXINE 100 MCG TAB: 100 | 30 days supply | Qty: 30 | Fill #0

## 2017-07-15 ENCOUNTER — Telehealth: Payer: Self-pay | Admitting: *Deleted

## 2017-07-15 NOTE — Telephone Encounter (Signed)
Pt. Returned nurse call. Please f/u with pt.  °

## 2017-07-15 NOTE — Telephone Encounter (Signed)
Left message on voicemail for patient to return call by assistance of interpreter: [phone]   Notes recorded by Jaclyn ShaggyAmao, Enobong, MD on 07/13/2017 at 12:01 PM EDT Thyroid lab is abnormal even though improved from 3 months ago. I have sent an increased dose of Levothyroxine,100 g to her pharmacy.

## 2017-07-15 NOTE — Telephone Encounter (Signed)
Pt aware of results and dose change to medication. Pt verbalized understanding. Refused use/ need for interpreter.

## 2017-08-02 ENCOUNTER — Other Ambulatory Visit: Payer: Self-pay | Admitting: Family Medicine

## 2017-08-16 MED FILL — LEVOTHYROXINE 100 MCG TAB: 100 | 30 days supply | Qty: 30 | Fill #1

## 2017-09-21 MED FILL — LEVOTHYROXINE 100 MCG TAB: 100 | 30 days supply | Qty: 30 | Fill #2

## 2017-11-08 MED FILL — LEVOTHYROXINE 100 MCG TAB: 100 | 30 days supply | Qty: 30 | Fill #3

## 2017-12-15 ENCOUNTER — Telehealth: Payer: Self-pay | Admitting: Family Medicine

## 2017-12-15 DIAGNOSIS — O99282 Endocrine, nutritional and metabolic diseases complicating pregnancy, second trimester: Principal | ICD-10-CM

## 2017-12-15 DIAGNOSIS — E039 Hypothyroidism, unspecified: Secondary | ICD-10-CM

## 2017-12-15 DIAGNOSIS — O09892 Supervision of other high risk pregnancies, second trimester: Secondary | ICD-10-CM

## 2017-12-15 MED ORDER — LEVOTHYROXINE SODIUM 100 MCG PO TABS: 100.0000 ug | ORAL_TABLET | Freq: Every day | ORAL | 0 refills | Status: DC

## 2017-12-15 NOTE — Telephone Encounter (Signed)
Pt called to request a refill for levothyroxine (SYNTHROID, LEVOTHROID) 100 MCG tablet  [facility], please follow up

## 2017-12-15 NOTE — Telephone Encounter (Signed)
Refilled to last until appt 12/21/17

## 2017-12-21 ENCOUNTER — Ambulatory Visit: Payer: Self-pay | Attending: Family Medicine | Admitting: Physician Assistant

## 2017-12-21 ENCOUNTER — Other Ambulatory Visit: Payer: Self-pay

## 2017-12-21 VITALS — BP 121/69 | HR 64 | Temp 98.6°F | Resp 16 | Wt 178.0 lb

## 2017-12-21 DIAGNOSIS — D649 Anemia, unspecified: Secondary | ICD-10-CM | POA: Insufficient documentation

## 2017-12-21 DIAGNOSIS — R5383 Other fatigue: Secondary | ICD-10-CM | POA: Insufficient documentation

## 2017-12-21 DIAGNOSIS — E038 Other specified hypothyroidism: Secondary | ICD-10-CM | POA: Insufficient documentation

## 2017-12-21 DIAGNOSIS — Z79890 Hormone replacement therapy (postmenopausal): Secondary | ICD-10-CM | POA: Insufficient documentation

## 2017-12-21 NOTE — Progress Notes (Signed)
Med RF Out of medication since Friday

## 2017-12-21 NOTE — Progress Notes (Signed)
   [address] Re[address]io Mejia, is a 32 y.o. female  ZOX:[id number]  [id number]  DOB - 04-Jan-1986  Subjective:  Chief Complaint and HPI: Heather Mejia is a 32 y.o. female here today for thyroid check.  No problems or complaints other than ongoing fatigue.  She ran out of thyroid meds about 6 days ago.  She has been taking 100mcg synthroid daily.  Periods are regular and monthly.  She doesn't feel they are extremely heavy.    No interpreter was needed/used.    ROS:   Constitutional:  No f/c, No night sweats, No unexplained weight loss. EENT:  No vision changes, No blurry vision, No hearing changes. No mouth, throat, or ear problems.  Respiratory: No cough, No SOB Cardiac: No CP, no palpitations GI:  No abd pain, No N/V/D. GU: No Urinary s/sx Musculoskeletal: No joint pain Neuro: No headache, no dizziness, no motor weakness.  Skin: No rash Endocrine:  No polydipsia. No polyuria.  Psych: Denies SI/HI  No problems updated.  ALLERGIES: No Known Allergies  PAST MEDICAL HISTORY: Past Medical History:  Diagnosis Date  . Chlamydia   . Hypothyroidism   . Vaginal Pap smear, abnormal     MEDICATIONS AT HOME: Prior to Admission medications   Medication Sig Start Date End Date Taking? Authorizing Provider  levothyroxine (SYNTHROID, LEVOTHROID) 100 MCG tablet Take 1 tablet (100 mcg total) by mouth daily. 12/15/17  Yes Jaclyn ShaggyAmao, Enobong, MD  ibuprofen (ADVIL,MOTRIN) 600 MG tablet Take 1 tablet (600 mg total) by mouth every 6 (six) hours. Patient not taking: Reported on 07/12/2017 03/19/16   Tereso NewcomerAnyanwu, Ugonna A, MD     Objective:  EXAM:   Vitals:   12/21/17 1548  BP: 121/69  Pulse: 64  Resp: 16  Temp: 98.6 F (37 C)  TempSrc: Oral  SpO2: 100%  Weight: 178 lb (80.7 kg)    General appearance : A&OX3. NAD. Non-toxic-appearing HEENT: Atraumatic and Normocephalic.  PERRLA. EOM intact.   Neck: supple, no JVD. No cervical lymphadenopathy. No thyromegaly Chest/Lungs:   Breathing-non-labored, Good air entry bilaterally, breath sounds normal without rales, rhonchi, or wheezing  CVS: S1 S2 regular, no murmurs, gallops, rubs  Extremities: Bilateral Lower Ext shows no edema, both legs are warm to touch with = pulse throughout Neurology:  CN II-XII grossly intact, Non focal.   Psych:  TP linear. J/I WNL. Normal speech. Appropriate eye contact and affect.  Skin:  No Rash  Data Review No results found for: HGBA1C   Assessment & Plan   1. Other specified hypothyroidism Will send rx once levels are back tomorrow - Thyroid Panel With TSH  2. Fatigue, unspecified type Reviewed previous CBC and anemia is noted- - CBC with Differential/Platelet   Patient have been counseled extensively about nutrition and exercise  Return in about 6 months (around 06/20/2018) for Dr Alvis LemmingsNewlin for hypothyroid.  The patient was given clear instructions to go to ER or return to medical center if symptoms don't improve, worsen or new problems develop. The patient verbalized understanding. The patient was told to call to get lab results if they haven't heard anything in the next week.     Georgian CoAngela McClung, PA-C [facility] [place] , [place] [phone]   12/21/2017, 4:06 PMPatient ID: Heather Mejia, female   DOB: 04-Jan-1986, 32 y.o.   MRN: [id number]

## 2017-12-22 ENCOUNTER — Other Ambulatory Visit: Payer: Self-pay | Admitting: Physician Assistant

## 2017-12-22 DIAGNOSIS — O99282 Endocrine, nutritional and metabolic diseases complicating pregnancy, second trimester: Principal | ICD-10-CM

## 2017-12-22 DIAGNOSIS — O09892 Supervision of other high risk pregnancies, second trimester: Secondary | ICD-10-CM

## 2017-12-22 DIAGNOSIS — E039 Hypothyroidism, unspecified: Secondary | ICD-10-CM

## 2017-12-22 LAB — CBC WITH DIFFERENTIAL/PLATELET
Basophils Absolute: 0 10*3/uL (ref 0.0–0.2)
Basos: 0 %
EOS (ABSOLUTE): 0.1 10*3/uL (ref 0.0–0.4)
EOS: 2 %
HEMATOCRIT: 35.7 % (ref 34.0–46.6)
HEMOGLOBIN: 12.3 g/dL (ref 11.1–15.9)
Immature Grans (Abs): 0 10*3/uL (ref 0.0–0.1)
Immature Granulocytes: 0 %
LYMPHS ABS: 2.4 10*3/uL (ref 0.7–3.1)
Lymphs: 31 %
MCH: 29.6 pg (ref 26.6–33.0)
MCHC: 34.5 g/dL (ref 31.5–35.7)
MCV: 86 fL (ref 79–97)
MONOCYTES: 7 %
Monocytes Absolute: 0.5 10*3/uL (ref 0.1–0.9)
NEUTROS ABS: 4.5 10*3/uL (ref 1.4–7.0)
Neutrophils: 60 %
Platelets: 359 10*3/uL (ref 150–379)
RBC: 4.15 x10E6/uL (ref 3.77–5.28)
RDW: 15.5 % — ABNORMAL HIGH (ref 12.3–15.4)
WBC: 7.6 10*3/uL (ref 3.4–10.8)

## 2017-12-22 LAB — THYROID PANEL WITH TSH
Free Thyroxine Index: 1.4 (ref 1.2–4.9)
T3 Uptake Ratio: 22 % — ABNORMAL LOW (ref 24–39)
T4, Total: 6.5 ug/dL (ref 4.5–12.0)
TSH: 5 u[IU]/mL — AB (ref 0.450–4.500)

## 2017-12-22 MED ORDER — LEVOTHYROXINE SODIUM 100 MCG PO TABS: 100.0000 ug | ORAL_TABLET | Freq: Every day | ORAL | 1 refills | Status: DC

## 2017-12-26 ENCOUNTER — Telehealth: Payer: Self-pay | Admitting: Family Medicine

## 2017-12-26 NOTE — Telephone Encounter (Signed)
Pt. Called requesting her lab results that she had done on 12/21/17. Please f/u

## 2017-12-26 NOTE — Telephone Encounter (Signed)
Pt saw angela on 12/21/16. Please follow up

## 2017-12-28 MED FILL — LEVOTHYROXINE 100 MCG TAB: 100 | 7 days supply | Qty: 7 | Fill #0

## 2017-12-28 NOTE — Telephone Encounter (Signed)
Results given by Harle StanfordAlycia Farrington, RMA by assistance of front desk personnel.

## 2018-01-04 ENCOUNTER — Other Ambulatory Visit: Payer: Self-pay | Admitting: Family Medicine

## 2018-01-04 ENCOUNTER — Ambulatory Visit: Payer: Self-pay | Attending: Family Medicine

## 2018-01-04 DIAGNOSIS — E038 Other specified hypothyroidism: Secondary | ICD-10-CM

## 2018-01-05 ENCOUNTER — Other Ambulatory Visit: Payer: Self-pay | Admitting: Family Medicine

## 2018-01-05 DIAGNOSIS — E039 Hypothyroidism, unspecified: Secondary | ICD-10-CM

## 2018-01-05 DIAGNOSIS — O99282 Endocrine, nutritional and metabolic diseases complicating pregnancy, second trimester: Principal | ICD-10-CM

## 2018-01-05 LAB — TSH: TSH: 1.4 u[IU]/mL (ref 0.450–4.500)

## 2018-01-05 LAB — T4, FREE: FREE T4: 1.39 ng/dL (ref 0.82–1.77)

## 2018-01-05 MED ORDER — LEVOTHYROXINE SODIUM 100 MCG PO TABS: 100.0000 ug | ORAL_TABLET | Freq: Every day | ORAL | 1 refills | Status: DC

## 2018-01-05 MED FILL — ?LEVOTHYROXINE 100 MCG TAB: 100 | 30 days supply | Qty: 30 | Fill #0

## 2018-01-10 ENCOUNTER — Telehealth: Payer: Self-pay

## 2018-01-10 NOTE — Telephone Encounter (Signed)
Pt was called and informed of lab results via interpretor. 

## 2018-02-07 MED FILL — ?LEVOTHYROXINE 100 MCG TAB: 100 | 30 days supply | Qty: 30 | Fill #1

## 2018-02-27 ENCOUNTER — Encounter: Payer: Self-pay | Admitting: *Deleted

## 2018-03-16 MED FILL — ?LEVOTHYROXINE 100 MCG TAB: 100 | 30 days supply | Qty: 30 | Fill #2

## 2018-03-20 ENCOUNTER — Encounter: Payer: Self-pay | Admitting: *Deleted

## 2018-04-05 ENCOUNTER — Ambulatory Visit: Payer: Self-pay | Attending: Family Medicine

## 2018-04-17 MED FILL — ?LEVOTHYROXINE 100 MCG TAB: 100 | 30 days supply | Qty: 30 | Fill #3

## 2018-05-23 MED FILL — ?LEVOTHYROXINE 100 MCG TAB: 100 | 30 days supply | Qty: 30 | Fill #4

## 2018-07-18 MED FILL — ?LEVOTHYROXINE 100 MCG TAB: 100 | 30 days supply | Qty: 30 | Fill #5

## 2018-08-28 MED FILL — ?LEVOTHYROXINE 100 MCG TAB: 100 | 30 days supply | Qty: 30 | Fill #0

## 2018-09-28 MED FILL — ?LEVOTHYROXINE 100 MCG TAB: 100 | 30 days supply | Qty: 30 | Fill #1

## 2018-11-08 MED FILL — ?LEVOTHYROXINE 100 MCG TAB: 100 | 30 days supply | Qty: 30 | Fill #2

## 2018-12-29 ENCOUNTER — Other Ambulatory Visit: Payer: Self-pay | Admitting: Physician Assistant

## 2018-12-29 DIAGNOSIS — O99282 Endocrine, nutritional and metabolic diseases complicating pregnancy, second trimester: Principal | ICD-10-CM

## 2018-12-29 DIAGNOSIS — E039 Hypothyroidism, unspecified: Secondary | ICD-10-CM

## 2019-01-01 MED FILL — ?LEVOTHYROXINE 100 MCG TAB: 100 | 30 days supply | Qty: 30 | Fill #0

## 2019-01-23 ENCOUNTER — Ambulatory Visit: Payer: Self-pay | Attending: Family Medicine | Admitting: Family Medicine

## 2019-01-23 ENCOUNTER — Encounter: Payer: Self-pay | Admitting: Family Medicine

## 2019-01-23 VITALS — BP 98/61 | HR 61 | Temp 98.0°F | Ht 61.0 in | Wt 176.6 lb

## 2019-01-23 DIAGNOSIS — Z331 Pregnant state, incidental: Secondary | ICD-10-CM

## 2019-01-23 DIAGNOSIS — R5383 Other fatigue: Secondary | ICD-10-CM | POA: Insufficient documentation

## 2019-01-23 DIAGNOSIS — Z23 Encounter for immunization: Secondary | ICD-10-CM

## 2019-01-23 DIAGNOSIS — E039 Hypothyroidism, unspecified: Secondary | ICD-10-CM

## 2019-01-23 DIAGNOSIS — O99282 Endocrine, nutritional and metabolic diseases complicating pregnancy, second trimester: Secondary | ICD-10-CM

## 2019-01-23 DIAGNOSIS — E038 Other specified hypothyroidism: Secondary | ICD-10-CM | POA: Insufficient documentation

## 2019-01-23 NOTE — Progress Notes (Signed)
Subjective:  Patient ID: Heather Mejia, female    DOB: 24-Nov-1986  Age: 33 y.o. MRN: [id number]  CC: Hypothyroidism   HPI Heather Mejia is a 33 year-old female with a past medical history of hypothyroidism. She is here today for follow up of her hypothyroidism.  Hypothyroidism: Last TSH 1.40 She currently is taking Synthroid 100 mcg daily; she states she takes it on an empty stomach. She is still experiencing fatigue, but says it is hard to tell if it is from her thyroid or trying to keep up with her children. She denies weight gain, constipation, dry skin, cold or heat intolerance.  Health Maintenance: Flu Vaccine - She is agreeable to.                                   Due for PAP smear Past Medical History:  Diagnosis Date  . Chlamydia   . Hypothyroidism   . Vaginal Pap smear, abnormal    Past Surgical History:  Procedure Laterality Date  . BILATERAL SALPINGECTOMY Right 07/12/2016   Procedure: SALPINGECTOMY;  Surgeon: Reva Boresanya S Pratt, MD;  Location: [facility];  Service: Gynecology;  Laterality: Right;  . CESAREAN SECTION N/A 03/17/2016   Procedure: CESAREAN SECTION;  Surgeon: Reva Boresanya S Pratt, MD;  Location: [facility];  Service: Obstetrics;  Laterality: N/A;  . DIAGNOSTIC LAPAROSCOPY WITH REMOVAL OF ECTOPIC PREGNANCY Right 07/12/2016   Procedure: DIAGNOSTIC LAPAROSCOPY WITH REMOVAL OF ECTOPIC PREGNANCY;  Surgeon: Reva Boresanya S Pratt, MD;  Location: [facility];  Service: Gynecology;  Laterality: Right;   Social History   Socioeconomic History  . Marital status: Single    Spouse name: Not on file  . Number of children: Not on file  . Years of education: Not on file  . Highest education level: Not on file  Occupational History  . Not on file  Social Needs  . Financial resource strain: Not on file  . Food insecurity:    Worry: Not on file    Inability: Not on file  . Transportation needs:    Medical: Not on file    Non-medical: Not on file  Tobacco Use  . Smoking  status: Never Smoker  . Smokeless tobacco: Never Used  Substance and Sexual Activity  . Alcohol use: No  . Drug use: No  . Sexual activity: Yes    Birth control/protection: None  Lifestyle  . Physical activity:    Days per week: Not on file    Minutes per session: Not on file  . Stress: Not on file  Relationships  . Social connections:    Talks on phone: Not on file    Gets together: Not on file    Attends religious service: Not on file    Active member of club or organization: Not on file    Attends meetings of clubs or organizations: Not on file    Relationship status: Not on file  Other Topics Concern  . Not on file  Social History Narrative  . Not on file   Family History  Problem Relation Age of Onset  . Cancer Paternal Uncle   . Polydactyly Son        post-axial on right foot; different father from current pregnancy  . Rheum arthritis Sister      Outpatient Medications Prior to Visit  Medication Sig Dispense Refill  . levothyroxine (SYNTHROID, LEVOTHROID) 100 MCG tablet Take 1 tablet (100  mcg total) by mouth daily. MUST MAKE APPT FOR FURTHER REFILLS 30 tablet 0  . ibuprofen (ADVIL,MOTRIN) 600 MG tablet Take 1 tablet (600 mg total) by mouth every 6 (six) hours. (Patient not taking: Reported on 07/12/2017) 60 tablet 2   No facility-administered medications prior to visit.     ROS Review of Systems  Constitutional: Negative for activity change, appetite change, chills, fatigue, fever and unexpected weight change.  Respiratory: Negative for shortness of breath and wheezing.   Cardiovascular: Negative for chest pain and palpitations.  Gastrointestinal: Negative for constipation and diarrhea.  Endocrine: Negative.  Negative for cold intolerance and heat intolerance.    Objective:  BP 98/61   Pulse 61   Temp 98 F (36.7 C) (Oral)   Ht 5\' 1"  (1.549 m)   Wt 176 lb 9.6 oz (80.1 kg)   SpO2 99%   BMI 33.37 kg/m   BP/Weight 01/23/2019 12/21/2017 07/12/2017  Systolic  BP 98 121 111  Diastolic BP 61 69 70  Wt. (Lbs) 176.6 178 169.6  BMI 33.37 32.56 31.02    Physical Exam Constitutional:      General: She is not in acute distress.    Appearance: Normal appearance. She is normal weight. She is not ill-appearing.  HENT:     Head: Normocephalic.  Neck:     Musculoskeletal: Normal range of motion.     Thyroid: No thyroid mass, thyromegaly or thyroid tenderness.  Cardiovascular:     Rate and Rhythm: Normal rate and regular rhythm.     Pulses: Normal pulses.     Heart sounds: Normal heart sounds. No murmur. No friction rub. No gallop.   Pulmonary:     Effort: Pulmonary effort is normal.     Breath sounds: Normal breath sounds. No wheezing.  Abdominal:     General: Bowel sounds are normal.     Palpations: Abdomen is soft.  Skin:    General: Skin is warm and dry.     Capillary Refill: Capillary refill takes less than 2 seconds.  Neurological:     General: No focal deficit present.     Mental Status: She is alert and oriented to person, place, and time.  Psychiatric:        Mood and Affect: Mood normal.        Behavior: Behavior normal.        Thought Content: Thought content normal.        Judgment: Judgment normal.      Assessment & Plan:   1. Other specified hypothyroidism Euthyroid - Last TSH 1.4 January 01/04/2018 - TSH - T4, free - Basic Metabolic Panel   No orders of the defined types were placed in this encounter.   Follow-up: Return in about 1 month (around 02/21/2019) for Pap smear.   Hoy RegisterEnobong Matilyn Fehrman MD

## 2019-01-23 NOTE — Patient Instructions (Signed)
Hypothyroidism  Hypothyroidism is when the thyroid gland does not make enough of certain hormones (it is underactive). The thyroid gland is a small gland located in the lower front part of the neck, just in front of the windpipe (trachea). This gland makes hormones that help control how the body uses food for energy (metabolism) as well as how the heart and brain function. These hormones also play a role in keeping your bones strong. When the thyroid is underactive, it produces too little of the hormones thyroxine (T4) and triiodothyronine (T3). What are the causes? This condition may be caused by:  Hashimoto's disease. This is a disease in which the body's disease-fighting system (immune system) attacks the thyroid gland. This is the most common cause.  Viral infections.  Pregnancy.  Certain medicines.  Birth defects.  Past radiation treatments to the head or neck for cancer.  Past treatment with radioactive iodine.  Past exposure to radiation in the environment.  Past surgical removal of part or all of the thyroid.  Problems with a gland in the center of the brain (pituitary gland).  Lack of enough iodine in the diet. What increases the risk? You are more likely to develop this condition if:  You are female.  You have a family history of thyroid conditions.  You use a medicine called lithium.  You take medicines that affect the immune system (immunosuppressants). What are the signs or symptoms? Symptoms of this condition include:  Feeling as though you have no energy (lethargy).  Not being able to tolerate cold.  Weight gain that is not explained by a change in diet or exercise habits.  Lack of appetite.  Dry skin.  Coarse hair.  Menstrual irregularity.  Slowing of thought processes.  Constipation.  Sadness or depression. How is this diagnosed? This condition may be diagnosed based on:  Your symptoms, your medical history, and a physical exam.  Blood  tests. You may also have imaging tests, such as an ultrasound or MRI. How is this treated? This condition is treated with medicine that replaces the thyroid hormones that your body does not make. After you begin treatment, it may take several weeks for symptoms to go away. Follow these instructions at home:  Take over-the-counter and prescription medicines only as told by your health care provider.  If you start taking any new medicines, tell your health care provider.  Keep all follow-up visits as told by your health care provider. This is important. ? As your condition improves, your dosage of thyroid hormone medicine may change. ? You will need to have blood tests regularly so that your health care provider can monitor your condition. Contact a health care provider if:  Your symptoms do not get better with treatment.  You are taking thyroid replacement medicine and you: ? Sweat a lot. ? Have tremors. ? Feel anxious. ? Lose weight rapidly. ? Cannot tolerate heat. ? Have emotional swings. ? Have diarrhea. ? Feel weak. Get help right away if you have:  Chest pain.  An irregular heartbeat.  A rapid heartbeat.  Difficulty breathing. Summary  Hypothyroidism is when the thyroid gland does not make enough of certain hormones (it is underactive).  When the thyroid is underactive, it produces too little of the hormones thyroxine (T4) and triiodothyronine (T3).  The most common cause is Hashimoto's disease, a disease in which the body's disease-fighting system (immune system) attacks the thyroid gland. The condition can also be caused by viral infections, medicine, pregnancy, or past   radiation treatment to the head or neck.  Symptoms may include weight gain, dry skin, constipation, feeling as though you do not have energy, and not being able to tolerate cold.  This condition is treated with medicine to replace the thyroid hormones that your body does not make. This information  is not intended to replace advice given to you by your health care provider. Make sure you discuss any questions you have with your health care provider. Document Released: 12/06/2005 Document Revised: 11/16/2017 Document Reviewed: 11/16/2017 Elsevier Interactive Patient Education  2019 Elsevier Inc.  

## 2019-01-24 LAB — BASIC METABOLIC PANEL
BUN/Creatinine Ratio: 13 (ref 9–23)
BUN: 10 mg/dL (ref 6–20)
CALCIUM: 9 mg/dL (ref 8.7–10.2)
CO2: 23 mmol/L (ref 20–29)
Chloride: 102 mmol/L (ref 96–106)
Creatinine, Ser: 0.75 mg/dL (ref 0.57–1.00)
GFR, EST AFRICAN AMERICAN: 124 mL/min/{1.73_m2} (ref >59)
GFR, EST NON AFRICAN AMERICAN: 107 mL/min/{1.73_m2} (ref >59)
Glucose: 84 mg/dL (ref 65–99)
POTASSIUM: 3.8 mmol/L (ref 3.5–5.2)
Sodium: 138 mmol/L (ref 134–144)

## 2019-01-24 LAB — TSH: TSH: 1.79 u[IU]/mL (ref 0.450–4.500)

## 2019-01-24 LAB — T4, FREE: Free T4: 1.34 ng/dL (ref 0.82–1.77)

## 2019-01-24 MED ORDER — LEVOTHYROXINE SODIUM 100 MCG PO TABS: 100.0000 ug | ORAL_TABLET | Freq: Every day | ORAL | 6 refills | Status: DC

## 2019-02-01 MED FILL — ?LEVOTHYROXINE 100 MCG TAB: 100 | 30 days supply | Qty: 30 | Fill #0

## 2019-02-02 ENCOUNTER — Telehealth: Payer: Self-pay

## 2019-02-02 NOTE — Telephone Encounter (Signed)
-----   Message from Hoy Register, MD sent at 01/24/2019  8:19 AM EST ----- Labs are normal

## 2019-02-02 NOTE — Telephone Encounter (Signed)
Patient was called and informed of lab results via interpreter [place] [phone].

## 2019-02-09 ENCOUNTER — Ambulatory Visit: Payer: Self-pay | Attending: Family Medicine

## 2019-03-06 ENCOUNTER — Encounter: Payer: Self-pay | Admitting: Family Medicine

## 2019-03-06 ENCOUNTER — Ambulatory Visit: Payer: Self-pay | Attending: Family Medicine | Admitting: Family Medicine

## 2019-03-06 ENCOUNTER — Other Ambulatory Visit: Payer: Self-pay

## 2019-03-06 VITALS — BP 115/66 | HR 54 | Temp 98.1°F | Wt 174.0 lb

## 2019-03-06 DIAGNOSIS — Z124 Encounter for screening for malignant neoplasm of cervix: Secondary | ICD-10-CM

## 2019-03-06 NOTE — Progress Notes (Signed)
Subjective:  Patient ID: Heather Mejia, female    DOB: 05-Jan-1986  Age: 33 y.o. MRN: [id number]  CC: Gynecologic Exam   HPI Heather Mejia is a 33 year old female with a history of hypothyroidism who presents today for a Pap smear.  Past Medical History:  Diagnosis Date  . Chlamydia   . Hypothyroidism   . Vaginal Pap smear, abnormal     Past Surgical History:  Procedure Laterality Date  . BILATERAL SALPINGECTOMY Right 07/12/2016   Procedure: SALPINGECTOMY;  Surgeon: Reva Bores, MD;  Location: [facility];  Service: Gynecology;  Laterality: Right;  . CESAREAN SECTION N/A 03/17/2016   Procedure: CESAREAN SECTION;  Surgeon: Reva Bores, MD;  Location: [facility];  Service: Obstetrics;  Laterality: N/A;  . DIAGNOSTIC LAPAROSCOPY WITH REMOVAL OF ECTOPIC PREGNANCY Right 07/12/2016   Procedure: DIAGNOSTIC LAPAROSCOPY WITH REMOVAL OF ECTOPIC PREGNANCY;  Surgeon: Reva Bores, MD;  Location: [facility];  Service: Gynecology;  Laterality: Right;    Family History  Problem Relation Age of Onset  . Cancer Paternal Uncle   . Polydactyly Son        post-axial on right foot; different father from current pregnancy  . Rheum arthritis Sister     No Known Allergies  Outpatient Medications Prior to Visit  Medication Sig Dispense Refill  . ibuprofen (ADVIL,MOTRIN) 600 MG tablet Take 1 tablet (600 mg total) by mouth every 6 (six) hours. (Patient not taking: Reported on 07/12/2017) 60 tablet 2  . levothyroxine (SYNTHROID, LEVOTHROID) 100 MCG tablet Take 1 tablet (100 mcg total) by mouth daily. MUST MAKE APPT FOR FURTHER REFILLS 30 tablet 6   No facility-administered medications prior to visit.      ROS Review of Systems  Constitutional: Negative for activity change, appetite change and fatigue.  HENT: Negative for congestion, sinus pressure and sore throat.   Eyes: Negative for visual disturbance.  Respiratory: Negative for cough, chest tightness, shortness of breath and  wheezing.   Cardiovascular: Negative for chest pain and palpitations.  Gastrointestinal: Negative for abdominal distention, abdominal pain and constipation.  Endocrine: Negative for polydipsia.  Genitourinary: Negative for dysuria and frequency.  Musculoskeletal: Negative for arthralgias and back pain.  Skin: Negative for rash.  Neurological: Negative for tremors, light-headedness and numbness.  Hematological: Does not bruise/bleed easily.  Psychiatric/Behavioral: Negative for agitation and behavioral problems.    Objective:  BP 115/66   Pulse (!) 54   Temp 98.1 F (36.7 C) (Oral)   Wt 174 lb (78.9 kg)   SpO2 100%   BMI 32.88 kg/m   BP/Weight 03/06/2019 01/23/2019 12/21/2017  Systolic BP 115 98 121  Diastolic BP 66 61 69  Wt. (Lbs) 174 176.6 178  BMI 32.88 33.37 32.56      Physical Exam Constitutional:      Appearance: She is well-developed.  Cardiovascular:     Rate and Rhythm: Normal rate.     Heart sounds: Normal heart sounds. No murmur.  Pulmonary:     Effort: Pulmonary effort is normal.     Breath sounds: Normal breath sounds. No wheezing or rales.  Chest:     Chest wall: No tenderness.  Abdominal:     General: Bowel sounds are normal. There is no distension.     Palpations: Abdomen is soft. There is no mass.     Tenderness: There is no abdominal tenderness.  Genitourinary:    Comments: External genitalia, vagina, cervix, adnexa-normal Musculoskeletal: Normal range of motion.  Neurological:  Mental Status: She is alert and oriented to person, place, and time.     CMP Latest Ref Rng & Units 01/23/2019 07/12/2017 01/10/2017  Glucose 65 - 99 mg/dL 84 86 784(O)  BUN 6 - 20 mg/dL 10 20 20   Creatinine 0.57 - 1.00 mg/dL 9.62 9.52 8.41  Sodium 134 - 144 mmol/L 138 137 137  Potassium 3.5 - 5.2 mmol/L 3.8 4.4 4.5  Chloride 96 - 106 mmol/L 102 99 103  CO2 20 - 29 mmol/L 23 23 24   Calcium 8.7 - 10.2 mg/dL 9.0 8.8 9.3  Total Protein 6.0 - 8.5 g/dL - 7.3 7.6  Total  Bilirubin 0.0 - 1.2 mg/dL - <3.2 0.3  Alkaline Phos 39 - 117 IU/L - 82 80  AST 0 - 40 IU/L - 25 21  ALT 0 - 32 IU/L - 25 22    Lipid Panel     Component Value Date/Time   CHOL 159 12/31/2008 2053   TRIG 103 12/31/2008 2053   HDL 36 (L) 12/31/2008 2053   CHOLHDL 4.4 Ratio 12/31/2008 2053   VLDL 21 12/31/2008 2053   LDLCALC 102 (H) 12/31/2008 2053    CBC    Component Value Date/Time   WBC 7.6 12/21/2017 1606   WBC 10.3 07/12/2016 1748   RBC 4.15 12/21/2017 1606   RBC 4.09 07/12/2016 1748   HGB 12.3 12/21/2017 1606   HGB 12.3 10/20/2015   HCT 35.7 12/21/2017 1606   HCT 36 10/20/2015   PLT 359 12/21/2017 1606   PLT 231 10/20/2015   MCV 86 12/21/2017 1606   MCH 29.6 12/21/2017 1606   MCH 24.4 (L) 07/12/2016 1748   MCHC 34.5 12/21/2017 1606   MCHC 29.6 (L) 07/12/2016 1748   RDW 15.5 (H) 12/21/2017 1606   LYMPHSABS 2.4 12/21/2017 1606   EOSABS 0.1 12/21/2017 1606   BASOSABS 0.0 12/21/2017 1606    No results found for: HGBA1C  Assessment & Plan:   1. Screening for cervical cancer - Cytology - PAP([place]) - Cervicovaginal ancillary only   No orders of the defined types were placed in this encounter.   Follow-up: Return in about 6 years (around 03/05/2025) for Follow-up of hypothyroidism.       Hoy Register, MD, FAAFP. [facility] [place], [place] [phone]   03/06/2019, 11:01 AM

## 2019-03-07 LAB — CERVICOVAGINAL ANCILLARY ONLY
Bacterial vaginitis: NEGATIVE
Candida vaginitis: NEGATIVE
Chlamydia: NEGATIVE
NEISSERIA GONORRHEA: NEGATIVE
Trichomonas: NEGATIVE

## 2019-03-08 LAB — CYTOLOGY - PAP
Diagnosis: NEGATIVE
HPV: NOT DETECTED

## 2019-03-09 ENCOUNTER — Telehealth: Payer: Self-pay

## 2019-03-09 NOTE — Telephone Encounter (Signed)
-----   Message from Hoy Register, MD sent at 03/08/2019  9:15 PM EDT ----- Please inform the patient that labs are normal. Thank you.

## 2019-03-09 NOTE — Telephone Encounter (Signed)
Patient name and DOB has been verified Patient was informed of lab results. Patient had no questions.  

## 2019-04-02 MED FILL — ?LEVOTHYROXINE 100 MCG TAB: 100 | 30 days supply | Qty: 30 | Fill #1

## 2019-04-17 ENCOUNTER — Ambulatory Visit: Payer: Medicaid Other | Admitting: Family Medicine

## 2019-05-07 MED FILL — ?LEVOTHYROXINE 100 MCG TAB: 100 | 30 days supply | Qty: 30 | Fill #2

## 2019-05-30 MED FILL — ?LEVOTHYROXINE 100 MCG TAB: 100 | 30 days supply | Qty: 30 | Fill #3

## 2019-07-10 MED FILL — ?LEVOTHYROXINE 100 MCG TAB: 100 | 30 days supply | Qty: 30 | Fill #4

## 2019-08-14 MED FILL — ?LEVOTHYROXINE 100 MCG TAB: 100 | 30 days supply | Qty: 30 | Fill #5

## 2019-09-24 MED FILL — ?LEVOTHYROXINE 100 MCG TABL: 100 | 30 days supply | Qty: 30 | Fill #6

## 2019-10-30 ENCOUNTER — Other Ambulatory Visit: Payer: Self-pay | Admitting: Family Medicine

## 2019-10-30 DIAGNOSIS — E039 Hypothyroidism, unspecified: Secondary | ICD-10-CM

## 2019-10-31 MED FILL — ?LEVOTHYROXINE 100 MCG TABL: 100 | 30 days supply | Qty: 30 | Fill #0

## 2019-11-29 ENCOUNTER — Telehealth: Payer: Self-pay | Admitting: Pharmacist

## 2019-11-29 MED FILL — LEVOTHYROXINE 100 MCG TAB: 100 | 30 days supply | Qty: 30 | Fill #1

## 2019-11-29 NOTE — Telephone Encounter (Signed)
Patient's pharmacy called requesting NDC change on levothyroxine. Will rout to PCP to make her aware. Pt encouraged to make lab/PCP appointment as she has not been seen since March of this year.

## 2020-01-01 ENCOUNTER — Emergency Department (HOSPITAL_COMMUNITY): Admission: EM | Admit: 2020-01-01 | Discharge: 2020-01-01 | Payer: Medicaid Other

## 2020-01-01 ENCOUNTER — Other Ambulatory Visit: Payer: Self-pay

## 2020-01-01 NOTE — ED Notes (Signed)
Pt stated she was leaving because she felt better.

## 2020-01-16 MED FILL — LEVOTHYROXINE SODIUM 100 MC: 100 | 30 days supply | Qty: 30 | Fill #2

## 2020-02-06 ENCOUNTER — Other Ambulatory Visit: Payer: Self-pay

## 2020-02-06 ENCOUNTER — Ambulatory Visit: Payer: Self-pay | Attending: Family Medicine

## 2020-02-20 ENCOUNTER — Ambulatory Visit: Payer: Self-pay | Attending: Family Medicine | Admitting: Family Medicine

## 2020-02-20 ENCOUNTER — Other Ambulatory Visit: Payer: Self-pay

## 2020-02-20 ENCOUNTER — Encounter: Payer: Self-pay | Admitting: Family Medicine

## 2020-02-20 VITALS — BP 122/74 | HR 51 | Ht 61.0 in | Wt 176.0 lb

## 2020-02-20 DIAGNOSIS — E038 Other specified hypothyroidism: Secondary | ICD-10-CM

## 2020-02-20 NOTE — Progress Notes (Signed)
Established Patient Office Visit  Subjective:  Patient ID: Heather Mejia, female    DOB: [birth year]  Age: 34 y.o. MRN: [id number]  CC:  Chief Complaint  Patient presents with  . Hypothyroidism    HPI Heather Mejia is a 34 year old female with a history of hypothyroidism who presents today for a follow up visit.  She is doing well and denies weight changes, constipation, and heat/cold intolerance.  Endorses compliance with medication and takes it on an empty stomach.  Last thyroid panel normal in February 2020.  No acute complaints today.  She is requesting to have an appointment to remove her Nexplanon if possible.  [facility] but she has been unable to reach them for an appointment.   Past Medical History:  Diagnosis Date  . Chlamydia   . Hypothyroidism   . Vaginal Pap smear, abnormal     Past Surgical History:  Procedure Laterality Date  . BILATERAL SALPINGECTOMY Right 07/12/2016   Procedure: SALPINGECTOMY;  Surgeon: Donnamae Jude, MD;  Location: [facility];  Service: Gynecology;  Laterality: Right;  . CESAREAN SECTION N/A 03/17/2016   Procedure: CESAREAN SECTION;  Surgeon: Donnamae Jude, MD;  Location: [facility];  Service: Obstetrics;  Laterality: N/A;  . DIAGNOSTIC LAPAROSCOPY WITH REMOVAL OF ECTOPIC PREGNANCY Right 07/12/2016   Procedure: DIAGNOSTIC LAPAROSCOPY WITH REMOVAL OF ECTOPIC PREGNANCY;  Surgeon: Donnamae Jude, MD;  Location: [facility];  Service: Gynecology;  Laterality: Right;    Family History  Problem Relation Age of Onset  . Cancer Paternal Uncle   . Polydactyly Son        post-axial on right foot; different father from current pregnancy  . Rheum arthritis Sister     Social History   Socioeconomic History  . Marital status: Single    Spouse name: Not on file  . Number of children: Not on file  . Years of education: Not on file  . Highest education level: Not on file  Occupational History  .  Not on file  Tobacco Use  . Smoking status: Never Smoker  . Smokeless tobacco: Never Used  Substance and Sexual Activity  . Alcohol use: No  . Drug use: No  . Sexual activity: Yes    Birth control/protection: None  Other Topics Concern  . Not on file  Social History Narrative  . Not on file   Social Determinants of Health   Financial Resource Strain:   . Difficulty of Paying Living Expenses: Not on file  Food Insecurity:   . Worried About Charity fundraiser in the Last Year: Not on file  . Ran Out of Food in the Last Year: Not on file  Transportation Needs:   . Lack of Transportation (Medical): Not on file  . Lack of Transportation (Non-Medical): Not on file  Physical Activity:   . Days of Exercise per Week: Not on file  . Minutes of Exercise per Session: Not on file  Stress:   . Feeling of Stress : Not on file  Social Connections:   . Frequency of Communication with Friends and Family: Not on file  . Frequency of Social Gatherings with Friends and Family: Not on file  . Attends Religious Services: Not on file  . Active Member of Clubs or Organizations: Not on file  . Attends Archivist Meetings: Not on file  . Marital Status: Not on file  Intimate Partner Violence:   . Fear  of Current or Ex-Partner: Not on file  . Emotionally Abused: Not on file  . Physically Abused: Not on file  . Sexually Abused: Not on file    Outpatient Medications Prior to Visit  Medication Sig Dispense Refill  . levothyroxine (SYNTHROID) 100 MCG tablet TAKE 1 TABLET (100 MCG TOTAL) BY MOUTH DAILY. MUST MAKE APPT FOR FURTHER REFILLS 30 tablet 6  . ibuprofen (ADVIL,MOTRIN) 600 MG tablet Take 1 tablet (600 mg total) by mouth every 6 (six) hours. (Patient not taking: Reported on 07/12/2017) 60 tablet 2   No facility-administered medications prior to visit.    No Known Allergies  ROS Review of Systems  Constitutional: Negative for fatigue, fever and unexpected weight change.  HENT:  Negative for congestion, rhinorrhea, sinus pressure and sinus pain.   Eyes: Negative for visual disturbance.  Respiratory: Negative for cough, chest tightness and shortness of breath.   Cardiovascular: Negative for chest pain, palpitations and leg swelling.  Gastrointestinal: Negative for abdominal distention, abdominal pain, constipation, diarrhea, nausea and vomiting.  Endocrine: Negative for cold intolerance, heat intolerance, polydipsia and polyuria.  Genitourinary: Negative for decreased urine volume, difficulty urinating and dysuria.  Musculoskeletal: Negative for arthralgias and myalgias.  Skin: Negative for color change and rash.  Neurological: Negative for dizziness, tremors, weakness and numbness.  Hematological: Does not bruise/bleed easily.  Psychiatric/Behavioral: Negative for agitation and behavioral problems.      Objective:    Physical Exam  Constitutional: She is oriented to person, place, and time. She appears well-developed and well-nourished. No distress.  HENT:  Head: Normocephalic and atraumatic.  Eyes: Pupils are equal, round, and reactive to light. Conjunctivae and EOM are normal.  Neck: No thyromegaly present.  Cardiovascular: Regular rhythm, normal heart sounds and intact distal pulses. Bradycardia present.  Pulmonary/Chest: Effort normal and breath sounds normal. No respiratory distress. She has no wheezes.  Abdominal: Soft. Bowel sounds are normal. She exhibits no distension. There is no abdominal tenderness.  Musculoskeletal:        General: No edema. Normal range of motion.     Cervical back: Neck supple.  Neurological: She is alert and oriented to person, place, and time.  Skin: Skin is warm and dry. No rash noted.  Psychiatric: She has a normal mood and affect. Her behavior is normal.  Nursing note and vitals reviewed.   BP 122/74   Pulse (!) 51   Ht 5\' 1"  (1.549 m)   Wt 176 lb (79.8 kg)   SpO2 100%   BMI 33.25 kg/m  Wt Readings from Last 3  Encounters:  02/20/20 176 lb (79.8 kg)  03/06/19 174 lb (78.9 kg)  01/23/19 176 lb 9.6 oz (80.1 kg)     Health Maintenance Due  Topic Date Due  . INFLUENZA VACCINE  07/21/2019    There are no preventive care reminders to display for this patient.  Lab Results  Component Value Date   TSH 1.790 01/23/2019   Lab Results  Component Value Date   WBC 7.6 12/21/2017   HGB 12.3 12/21/2017   HCT 35.7 12/21/2017   MCV 86 12/21/2017   PLT 359 12/21/2017   Lab Results  Component Value Date   NA 138 01/23/2019   K 3.8 01/23/2019   CO2 23 01/23/2019   GLUCOSE 84 01/23/2019   BUN 10 01/23/2019   CREATININE 0.75 01/23/2019   BILITOT <0.2 07/12/2017   ALKPHOS 82 07/12/2017   AST 25 07/12/2017   ALT 25 07/12/2017   PROT 7.3 07/12/2017  ALBUMIN 4.2 07/12/2017   CALCIUM 9.0 01/23/2019   Lab Results  Component Value Date   CHOL 159 12/31/2008   Lab Results  Component Value Date   HDL 36 (L) 12/31/2008   Lab Results  Component Value Date   LDLCALC 102 (H) 12/31/2008   Lab Results  Component Value Date   TRIG 103 12/31/2008   Lab Results  Component Value Date   CHOLHDL 4.4 Ratio 12/31/2008   No results found for: HGBA1C    Assessment & Plan:   1. Other specified hypothyroidism Euthyroid Last TSH 1.79 01/2019 Thyroid panel today and will adjust medication if needed - T4, free - TSH - Basic Metabolic Panel   No orders of the defined types were placed in this encounter.   Follow-up: Return in about 6 days (around 02/26/2020) for nexplanon removal on 02/26/19 -  6 months chronic medical conditions.    Janann August, RN   Evaluation and management procedures were performed by me with DNP Student in attendance, note written by DNP student under my supervision and collaboration. I have reviewed the note and I agree with the management and plan.   Patient with controlled Hypothyroidism; last thyroid panel was normal in 01/23/2019.  She will continue her current  dose of levothyroxine which will be adjusted once her lab results are obtained.  Also scheduled for Nexplanon removal given she is unable to obtain an appointment at the health department.  Hoy Register, MD, FAAFP. [facility] [place], [place] [phone]   02/20/2020, 4:53 PM

## 2020-02-21 ENCOUNTER — Other Ambulatory Visit: Payer: Self-pay | Admitting: Family Medicine

## 2020-02-21 DIAGNOSIS — E039 Hypothyroidism, unspecified: Secondary | ICD-10-CM

## 2020-02-21 LAB — BASIC METABOLIC PANEL
BUN/Creatinine Ratio: 13 (ref 9–23)
BUN: 11 mg/dL (ref 6–20)
CO2: 25 mmol/L (ref 20–29)
Calcium: 9.4 mg/dL (ref 8.7–10.2)
Chloride: 101 mmol/L (ref 96–106)
Creatinine, Ser: 0.85 mg/dL (ref 0.57–1.00)
GFR calc Af Amer: 106 mL/min/{1.73_m2} (ref >59)
GFR calc non Af Amer: 92 mL/min/{1.73_m2} (ref >59)
Glucose: 92 mg/dL (ref 65–99)
Potassium: 4.6 mmol/L (ref 3.5–5.2)
Sodium: 139 mmol/L (ref 134–144)

## 2020-02-21 LAB — T4, FREE: Free T4: 1.5 ng/dL (ref 0.82–1.77)

## 2020-02-21 LAB — TSH: TSH: 1.14 u[IU]/mL (ref 0.450–4.500)

## 2020-02-21 MED ORDER — LEVOTHYROXINE SODIUM 100 MCG PO TABS: 100.0000 ug | ORAL_TABLET | Freq: Every day | ORAL | 6 refills | Status: DC

## 2020-02-21 MED FILL — LEVOTHYROXINE SODIUM 100 MC: 100 | 30 days supply | Qty: 30 | Fill #3

## 2020-02-26 ENCOUNTER — Other Ambulatory Visit: Payer: Self-pay

## 2020-02-26 ENCOUNTER — Encounter: Payer: Self-pay | Admitting: Family Medicine

## 2020-02-26 ENCOUNTER — Ambulatory Visit: Payer: Medicaid Other | Admitting: Family Medicine

## 2020-02-26 ENCOUNTER — Ambulatory Visit: Payer: Medicaid Other | Attending: Family Medicine | Admitting: Family Medicine

## 2020-02-26 VITALS — BP 114/75 | HR 58 | Ht 62.0 in | Wt 177.4 lb

## 2020-02-26 DIAGNOSIS — Z3046 Encounter for surveillance of implantable subdermal contraceptive: Secondary | ICD-10-CM | POA: Diagnosis not present

## 2020-02-26 DIAGNOSIS — Z30013 Encounter for initial prescription of injectable contraceptive: Secondary | ICD-10-CM | POA: Diagnosis not present

## 2020-02-26 LAB — POCT URINE PREGNANCY: Preg Test, Ur: NEGATIVE

## 2020-02-26 MED ORDER — MEDROXYPROGESTERONE ACETATE 150 MG/ML IM SUSP
150.0000 mg | Freq: Once | INTRAMUSCULAR | Status: AC
  Administered 2020-02-26: 11:00:00 150 mg via INTRAMUSCULAR

## 2020-02-26 NOTE — Progress Notes (Signed)
Subjective:  Patient ID: Heather Mejia, female    DOB: 12-Oct-1986  Age: 34 y.o. MRN: [id number]  CC:  Chief Complaint  Patient presents with  . Contraception     HPI Heather Mejia is a 34 year old female with a history of hypothyroidism who presents today for Nexplanon removal. [facility] but has been unable to reach them to have it removed. She would like to commence an injectable form of contraception once this has been normal. She has no additional concerns at this time.  Past Medical History:  Diagnosis Date  . Chlamydia   . Hypothyroidism   . Vaginal Pap smear, abnormal     Past Surgical History:  Procedure Laterality Date  . BILATERAL SALPINGECTOMY Right 07/12/2016   Procedure: SALPINGECTOMY;  Surgeon: Reva Bores, MD;  Location: [facility];  Service: Gynecology;  Laterality: Right;  . CESAREAN SECTION N/A 03/17/2016   Procedure: CESAREAN SECTION;  Surgeon: Reva Bores, MD;  Location: [facility];  Service: Obstetrics;  Laterality: N/A;  . DIAGNOSTIC LAPAROSCOPY WITH REMOVAL OF ECTOPIC PREGNANCY Right 07/12/2016   Procedure: DIAGNOSTIC LAPAROSCOPY WITH REMOVAL OF ECTOPIC PREGNANCY;  Surgeon: Reva Bores, MD;  Location: [facility];  Service: Gynecology;  Laterality: Right;    Family History  Problem Relation Age of Onset  . Cancer Paternal Uncle   . Polydactyly Son        post-axial on right foot; different father from current pregnancy  . Rheum arthritis Sister     No Known Allergies  Outpatient Medications Prior to Visit  Medication Sig Dispense Refill  . levothyroxine (SYNTHROID) 100 MCG tablet Take 1 tablet (100 mcg total) by mouth daily. 30 tablet 6  . ibuprofen (ADVIL,MOTRIN) 600 MG tablet Take 1 tablet (600 mg total) by mouth every 6 (six) hours. (Patient not taking: Reported on 07/12/2017) 60 tablet 2   No facility-administered medications prior to visit.     ROS Review of Systems    Constitutional: Negative for activity change, appetite change and fatigue.  HENT: Negative for congestion, sinus pressure and sore throat.   Eyes: Negative for visual disturbance.  Respiratory: Negative for cough, chest tightness, shortness of breath and wheezing.   Cardiovascular: Negative for chest pain and palpitations.  Gastrointestinal: Negative for abdominal distention, abdominal pain and constipation.  Endocrine: Negative for polydipsia.  Genitourinary: Negative for dysuria and frequency.  Musculoskeletal: Negative for arthralgias and back pain.  Skin: Negative for rash.  Neurological: Negative for tremors, light-headedness and numbness.  Hematological: Does not bruise/bleed easily.  Psychiatric/Behavioral: Negative for agitation and behavioral problems.    Objective:  BP 114/75   Pulse (!) 58   Ht 5\' 2"  (1.575 m)   Wt 177 lb 6.4 oz (80.5 kg)   SpO2 100%   BMI 32.45 kg/m   BP/Weight 02/26/2020 02/20/2020 03/06/2019  Systolic BP 114 122 115  Diastolic BP 75 74 66  Wt. (Lbs) 177.4 176 174  BMI 32.45 33.25 32.88      Physical Exam Constitutional:      Appearance: She is well-developed.  Neck:     Vascular: No JVD.  Cardiovascular:     Rate and Rhythm: Normal rate.     Heart sounds: Normal heart sounds. No murmur.  Pulmonary:     Effort: Pulmonary effort is normal.     Breath sounds: Normal breath sounds. No wheezing or rales.  Chest:     Chest wall: No tenderness.  Abdominal:     General: Bowel sounds are normal. There is no distension.     Palpations: Abdomen is soft. There is no mass.     Tenderness: There is no abdominal tenderness.  Musculoskeletal:        General: Normal range of motion.     Right lower leg: No edema.     Left lower leg: No edema.  Neurological:     Mental Status: She is alert and oriented to person, place, and time.  Psychiatric:        Mood and Affect: Mood normal.     .contr CMP Latest Ref Rng & Units 02/20/2020 01/23/2019 07/12/2017   Glucose 65 - 99 mg/dL 92 84 86  BUN 6 - 20 mg/dL 11 10 20   Creatinine 0.57 - 1.00 mg/dL 0.85 0.75 0.89  Sodium 134 - 144 mmol/L 139 138 137  Potassium 3.5 - 5.2 mmol/L 4.6 3.8 4.4  Chloride 96 - 106 mmol/L 101 102 99  CO2 20 - 29 mmol/L 25 23 23   Calcium 8.7 - 10.2 mg/dL 9.4 9.0 8.8  Total Protein 6.0 - 8.5 g/dL - - 7.3  Total Bilirubin 0.0 - 1.2 mg/dL - - <0.2  Alkaline Phos 39 - 117 IU/L - - 82  AST 0 - 40 IU/L - - 25  ALT 0 - 32 IU/L - - 25    Lipid Panel     Component Value Date/Time   CHOL 159 12/31/2008 2053   TRIG 103 12/31/2008 2053   HDL 36 (L) 12/31/2008 2053   CHOLHDL 4.4 Ratio 12/31/2008 2053   VLDL 21 12/31/2008 2053   LDLCALC 102 (H) 12/31/2008 2053    CBC    Component Value Date/Time   WBC 7.6 12/21/2017 1606   WBC 10.3 07/12/2016 1748   RBC 4.15 12/21/2017 1606   RBC 4.09 07/12/2016 1748   HGB 12.3 12/21/2017 1606   HGB 12.3 10/20/2015 0000   HCT 35.7 12/21/2017 1606   HCT 36 10/20/2015 0000   PLT 359 12/21/2017 1606   PLT 231 10/20/2015 0000   MCV 86 12/21/2017 1606   MCH 29.6 12/21/2017 1606   MCH 24.4 (L) 07/12/2016 1748   MCHC 34.5 12/21/2017 1606   MCHC 29.6 (L) 07/12/2016 1748   RDW 15.5 (H) 12/21/2017 1606   LYMPHSABS 2.4 12/21/2017 1606   EOSABS 0.1 12/21/2017 1606   BASOSABS 0.0 12/21/2017 1606    No results found for: HGBA1C  Assessment & Plan:  1. Encounter for initial prescription of injectable contraceptive Education given regarding options for contraception, including barrier methods, injectable contraception, IUD placement, oral contraceptives. After joint decision-making she decided to proceed with Depo-Provera injections  2. Encounter for Nexplanon removal Informed consent obtained Site of Nexplanon identified on left inner aspect of upper arm which was then prepped with Betadine.  0.5 mils of lidocaine with epinephrine were injected into the inferior distal aspect of Nexplanon.  #11 scalpel used in making incision at  distal end of insert while applying pressure at proximal end.  Curved forceps used to retrieve entire Nexplanon implant. Patient tolerated procedure well with minimal bleeding.  Steri-Strips applied postprocedure. Total procedure was conducted under aseptic technique.    No orders of the defined types were placed in this encounter.   Follow-up: Return in about 6 months (around 08/28/2020) for chronic medical conditions.       Charlott Rakes, MD, FAAFP. [facility] [place], [place]   02/26/2020, 10:19 AM

## 2020-02-26 NOTE — Addendum Note (Signed)
Addended by: Ronette Deter on: 02/26/2020 10:35 AM   Modules accepted: Orders

## 2020-02-26 NOTE — Patient Instructions (Signed)
Medroxyprogesterone injection [Contraceptive] What is this medicine? MEDROXYPROGESTERONE (me DROX ee proe JES te rone) contraceptive injections prevent pregnancy. They provide effective birth control for 3 months. Depo-subQ Provera 104 is also used for treating pain related to endometriosis. This medicine may be used for other purposes; ask your health care provider or pharmacist if you have questions. COMMON BRAND NAME(S): Depo-Provera, Depo-subQ Provera 104 What should I tell my health care provider before I take this medicine? They need to know if you have any of these conditions:  frequently drink alcohol  asthma  blood vessel disease or a history of a blood clot in the lungs or legs  bone disease such as osteoporosis  breast cancer  diabetes  eating disorder (anorexia nervosa or bulimia)  high blood pressure  HIV infection or AIDS  kidney disease  liver disease  mental depression  migraine  seizures (convulsions)  stroke  tobacco smoker  vaginal bleeding  an unusual or allergic reaction to medroxyprogesterone, other hormones, medicines, foods, dyes, or preservatives  pregnant or trying to get pregnant  breast-feeding How should I use this medicine? Depo-Provera Contraceptive injection is given into a muscle. Depo-subQ Provera 104 injection is given under the skin. These injections are given by a health care professional. You must not be pregnant before getting an injection. The injection is usually given during the first 5 days after the start of a menstrual period or 6 weeks after delivery of a baby. Talk to your pediatrician regarding the use of this medicine in children. Special care may be needed. These injections have been used in female children who have started having menstrual periods. Overdosage: If you think you have taken too much of this medicine contact a poison control center or emergency room at once. NOTE: This medicine is only for you. Do not  share this medicine with others. What if I miss a dose? Try not to miss a dose. You must get an injection once every 3 months to maintain birth control. If you cannot keep an appointment, call and reschedule it. If you wait longer than 13 weeks between Depo-Provera contraceptive injections or longer than 14 weeks between Depo-subQ Provera 104 injections, you could get pregnant. Use another method for birth control if you miss your appointment. You may also need a pregnancy test before receiving another injection. What may interact with this medicine? Do not take this medicine with any of the following medications:  bosentan This medicine may also interact with the following medications:  aminoglutethimide  antibiotics or medicines for infections, especially rifampin, rifabutin, rifapentine, and griseofulvin  aprepitant  barbiturate medicines such as phenobarbital or primidone  bexarotene  carbamazepine  medicines for seizures like ethotoin, felbamate, oxcarbazepine, phenytoin, topiramate  modafinil  St. John's wort This list may not describe all possible interactions. Give your health care provider a list of all the medicines, herbs, non-prescription drugs, or dietary supplements you use. Also tell them if you smoke, drink alcohol, or use illegal drugs. Some items may interact with your medicine. What should I watch for while using this medicine? This drug does not protect you against HIV infection (AIDS) or other sexually transmitted diseases. Use of this product may cause you to lose calcium from your bones. Loss of calcium may cause weak bones (osteoporosis). Only use this product for more than 2 years if other forms of birth control are not right for you. The longer you use this product for birth control the more likely you will be at risk   for weak bones. Ask your health care professional how you can keep strong bones. You may have a change in bleeding pattern or irregular periods.  Many females stop having periods while taking this drug. If you have received your injections on time, your chance of being pregnant is very low. If you think you may be pregnant, see your health care professional as soon as possible. Tell your health care professional if you want to get pregnant within the next year. The effect of this medicine may last a long time after you get your last injection. What side effects may I notice from receiving this medicine? Side effects that you should report to your doctor or health care professional as soon as possible:  allergic reactions like skin rash, itching or hives, swelling of the face, lips, or tongue  breast tenderness or discharge  breathing problems  changes in vision  depression  feeling faint or lightheaded, falls  fever  pain in the abdomen, chest, groin, or leg  problems with balance, talking, walking  unusually weak or tired  yellowing of the eyes or skin Side effects that usually do not require medical attention (report to your doctor or health care professional if they continue or are bothersome):  acne  fluid retention and swelling  headache  irregular periods, spotting, or absent periods  temporary pain, itching, or skin reaction at site where injected  weight gain This list may not describe all possible side effects. Call your doctor for medical advice about side effects. You may report side effects to FDA at 1-800-FDA-1088. Where should I keep my medicine? This does not apply. The injection will be given to you by a health care professional. NOTE: This sheet is a summary. It may not cover all possible information. If you have questions about this medicine, talk to your doctor, pharmacist, or health care provider.  2020 Elsevier/Gold Standard (2008-12-27 18:37:56)  

## 2020-02-26 NOTE — Progress Notes (Signed)
nexplanon removal

## 2020-03-26 MED FILL — LEVOTHYROXINE SODIUM 100 MC: 100 | 30 days supply | Qty: 30 | Fill #4

## 2020-05-16 ENCOUNTER — Ambulatory Visit: Payer: Medicaid Other | Attending: Family Medicine

## 2020-05-16 ENCOUNTER — Other Ambulatory Visit: Payer: Self-pay

## 2020-05-16 VITALS — Temp 98.0°F

## 2020-05-16 DIAGNOSIS — Z3042 Encounter for surveillance of injectable contraceptive: Secondary | ICD-10-CM

## 2020-05-16 MED ORDER — MEDROXYPROGESTERONE ACETATE 150 MG/ML IM SUSP
150.0000 mg | Freq: Once | INTRAMUSCULAR | Status: AC
  Administered 2020-05-16: 150 mg via INTRAMUSCULAR

## 2020-05-16 NOTE — Progress Notes (Signed)
DOB and Name verified Pt is here for her DEPO injections/ Last Depo-Provera: 02/26/2020 within time interval  Side Effects if any: none verbalized Serum HCG indicated?No Depo-Provera 150 mg IM given in L gluteus muscle .   Injection well tolerated. No reaction noted at the injection site. Explained pt the importance adherence to depo provera perpetual calendar. Education given to pt in regards to use extra protection and possible false negative HGC if antibiotic intake or missed intervals.Verbalized understanding  Next injection scheduled on August 13/     understanding

## 2020-06-04 MED FILL — LEVOTHYROXINE SODIUM 100 MC: 100 | 30 days supply | Qty: 30 | Fill #6

## 2020-06-18 ENCOUNTER — Encounter: Payer: Self-pay | Admitting: Family Medicine

## 2020-06-18 ENCOUNTER — Telehealth: Payer: Self-pay

## 2020-06-18 ENCOUNTER — Ambulatory Visit: Payer: Self-pay | Attending: Family Medicine | Admitting: Family Medicine

## 2020-06-18 ENCOUNTER — Other Ambulatory Visit: Payer: Self-pay

## 2020-06-18 VITALS — BP 111/66 | HR 57 | Ht 62.0 in | Wt 180.0 lb

## 2020-06-18 DIAGNOSIS — Z6832 Body mass index (BMI) 32.0-32.9, adult: Secondary | ICD-10-CM

## 2020-06-18 DIAGNOSIS — E038 Other specified hypothyroidism: Secondary | ICD-10-CM

## 2020-06-18 DIAGNOSIS — E66811 Obesity, class 1: Secondary | ICD-10-CM

## 2020-06-18 DIAGNOSIS — Z1159 Encounter for screening for other viral diseases: Secondary | ICD-10-CM

## 2020-06-18 DIAGNOSIS — E6609 Other obesity due to excess calories: Secondary | ICD-10-CM

## 2020-06-18 DIAGNOSIS — O99282 Endocrine, nutritional and metabolic diseases complicating pregnancy, second trimester: Secondary | ICD-10-CM

## 2020-06-18 MED ORDER — LEVOTHYROXINE SODIUM 100 MCG PO TABS: 100.0000 ug | ORAL_TABLET | Freq: Every day | ORAL | 0 refills | Status: DC

## 2020-06-18 MED ORDER — LEVOTHYROXINE SODIUM 100 MCG PO TABS: 100.0000 ug | ORAL_TABLET | Freq: Every day | ORAL | 1 refills | Status: DC

## 2020-06-18 NOTE — Telephone Encounter (Signed)
I spoke with the patient on the phone and have sent a 1 month supply to [facility] on [address].  If her dose needs to be adjusted when she returns from [place] she will pick up her new dose.

## 2020-06-18 NOTE — Telephone Encounter (Addendum)
Pt request refill levothyroxine for trip out of country. Pt had labs today. Provider unable to prescribe more until lab results back.

## 2020-06-18 NOTE — Patient Instructions (Signed)

## 2020-06-18 NOTE — Progress Notes (Signed)
Subjective:  Patient ID: Heather Mejia, female    DOB: August 25, 1986  Age: 34 y.o. MRN: [id number]  CC: Hypothyroidism   HPI Heather Mejia is a 34 year old female with a history of hypothyroidism here for chronic disease management. She endorses compliance with Levothyroxine and needs refills as she will be traveling out of town for 1 month.  Thyroid panel from 02/2020 was normal. With regards to exercise she is currently not compliant but was previously exercising until she hurt her left knee but her knee feels better at this time and she plans to resume. She has no additional concerns at this time.  Past Medical History:  Diagnosis Date  . Chlamydia   . Hypothyroidism   . Vaginal Pap smear, abnormal     Past Surgical History:  Procedure Laterality Date  . BILATERAL SALPINGECTOMY Right 07/12/2016   Procedure: SALPINGECTOMY;  Surgeon: Reva Bores, MD;  Location: [facility];  Service: Gynecology;  Laterality: Right;  . CESAREAN SECTION N/A 03/17/2016   Procedure: CESAREAN SECTION;  Surgeon: Reva Bores, MD;  Location: [facility];  Service: Obstetrics;  Laterality: N/A;  . DIAGNOSTIC LAPAROSCOPY WITH REMOVAL OF ECTOPIC PREGNANCY Right 07/12/2016   Procedure: DIAGNOSTIC LAPAROSCOPY WITH REMOVAL OF ECTOPIC PREGNANCY;  Surgeon: Reva Bores, MD;  Location: [facility];  Service: Gynecology;  Laterality: Right;    Family History  Problem Relation Age of Onset  . Cancer Paternal Uncle   . Polydactyly Son        post-axial on right foot; different father from current pregnancy  . Rheum arthritis Sister     No Known Allergies  Outpatient Medications Prior to Visit  Medication Sig Dispense Refill  . levothyroxine (SYNTHROID) 100 MCG tablet Take 1 tablet (100 mcg total) by mouth daily. 30 tablet 6  . ibuprofen (ADVIL,MOTRIN) 600 MG tablet Take 1 tablet (600 mg total) by mouth every 6 (six) hours. (Patient not taking: Reported on 07/12/2017) 60 tablet 2   No  facility-administered medications prior to visit.     ROS Review of Systems  Constitutional: Negative for activity change, appetite change and fatigue.  HENT: Negative for congestion, sinus pressure and sore throat.   Eyes: Negative for visual disturbance.  Respiratory: Negative for cough, chest tightness, shortness of breath and wheezing.   Cardiovascular: Negative for chest pain and palpitations.  Gastrointestinal: Negative for abdominal distention, abdominal pain and constipation.  Endocrine: Negative for polydipsia.  Genitourinary: Negative for dysuria and frequency.  Musculoskeletal: Negative for arthralgias and back pain.  Skin: Negative for rash.  Neurological: Negative for tremors, light-headedness and numbness.  Hematological: Does not bruise/bleed easily.  Psychiatric/Behavioral: Negative for agitation and behavioral problems.    Objective:  BP 111/66   Pulse (!) 57   Ht 5\' 2"  (1.575 m)   Wt 180 lb (81.6 kg)   SpO2 99%   BMI 32.92 kg/m   BP/Weight 06/18/2020 02/26/2020 02/20/2020  Systolic BP 111 114 122  Diastolic BP 66 75 74  Wt. (Lbs) 180 177.4 176  BMI 32.92 32.45 33.25      Physical Exam Constitutional:      Appearance: She is well-developed.  Neck:     Vascular: No JVD.  Cardiovascular:     Rate and Rhythm: Bradycardia present.     Heart sounds: Normal heart sounds. No murmur heard.   Pulmonary:     Effort: Pulmonary effort is normal.     Breath sounds: Normal breath sounds. No wheezing or rales.  Chest:  Chest wall: No tenderness.  Abdominal:     General: Bowel sounds are normal. There is no distension.     Palpations: Abdomen is soft. There is no mass.     Tenderness: There is no abdominal tenderness.  Musculoskeletal:        General: Normal range of motion.     Right lower leg: No edema.     Left lower leg: No edema.  Neurological:     Mental Status: She is alert and oriented to person, place, and time.  Psychiatric:        Mood and  Affect: Mood normal.     CMP Latest Ref Rng & Units 02/20/2020 01/23/2019 07/12/2017  Glucose 65 - 99 mg/dL 92 84 86  BUN 6 - 20 mg/dL 11 10 20   Creatinine 0.57 - 1.00 mg/dL 7.86 7.67  Sodium 134 - 144 mmol/L 139 138 137  Potassium 3.5 - 5.2 mmol/L 4.6 3.8 4.4  Chloride 96 - 106 mmol/L 101 102 99  CO2 20 - 29 mmol/L 25 23 23   Calcium 8.7 - 10.2 mg/dL 9.4 9.0 8.8  Total Protein 6.0 - 8.5 g/dL - - 7.3  Total Bilirubin 0.0 - 1.2 mg/dL - - 2.09  Alkaline Phos 39 - 117 IU/L - - 82  AST 0 - 40 IU/L - - 25  ALT 0 - 32 IU/L - - 25    Lipid Panel     Component Value Date/Time   CHOL 159 12/31/2008 2053   TRIG 103 12/31/2008 2053   HDL 36 (L) 12/31/2008 2053   CHOLHDL 4.4 Ratio 12/31/2008 2053   VLDL 21 12/31/2008 2053   LDLCALC 102 (H) 12/31/2008 2053    CBC    Component Value Date/Time   WBC 7.6 12/21/2017 1606   WBC 10.3 07/12/2016 1748   RBC 4.15 12/21/2017 1606   RBC 4.09 07/12/2016 1748   HGB 12.3 12/21/2017 1606   HGB 12.3 10/20/2015 0000   HCT 35.7 12/21/2017 1606   HCT 36 10/20/2015 0000   PLT 359 12/21/2017 1606   PLT 231 10/20/2015 0000   MCV 86 12/21/2017 1606   MCH 29.6 12/21/2017 1606   MCH 24.4 (L) 07/12/2016 1748   MCHC 34.5 12/21/2017 1606   MCHC 29.6 (L) 07/12/2016 1748   RDW 15.5 (H) 12/21/2017 1606   LYMPHSABS 2.4 12/21/2017 1606   EOSABS 0.1 12/21/2017 1606   BASOSABS 0.0 12/21/2017 1606    Lab Results  Component Value Date   TSH 1.140 02/20/2020     Assessment & Plan:  1. Other specified hypothyroidism Controlled Continue levothyroxine - T4, free - TSH  2. Need for hepatitis C screening test - HCV RNA quant rflx ultra or genotyp([facility])  3. Class 1 obesity due to excess calories without serious comorbidity with body mass index (BMI) of 32.0 to 32.9 in adult Discussed reducing portion sizes, increasing physical activity  Return in about 6 months (around 12/18/2020) for Follow-up on hypothyroidism.      04/21/2020, MD, FAAFP. [facility], [facility] [place]   06/18/2020, 4:34 PM

## 2020-06-20 ENCOUNTER — Other Ambulatory Visit: Payer: Self-pay | Admitting: Family Medicine

## 2020-06-20 DIAGNOSIS — E039 Hypothyroidism, unspecified: Secondary | ICD-10-CM

## 2020-06-20 LAB — HCV RNA QUANT RFLX ULTRA OR GENOTYP: HCV Quant Baseline: NOT DETECTED IU/mL

## 2020-06-20 LAB — T4, FREE: Free T4: 1.62 ng/dL (ref 0.82–1.77)

## 2020-06-20 LAB — TSH: TSH: 1.73 u[IU]/mL (ref 0.450–4.500)

## 2020-06-20 MED ORDER — LEVOTHYROXINE SODIUM 100 MCG PO TABS: 100.0000 ug | ORAL_TABLET | Freq: Every day | ORAL | 6 refills | Status: DC

## 2020-06-20 MED FILL — LEVOTHYROXINE SODIUM 100 MC: 100 | 30 days supply | Qty: 30 | Fill #0

## 2020-07-25 MED FILL — LEVOTHYROXINE SODIUM 100 MC: 100 | 30 days supply | Qty: 30 | Fill #0

## 2020-08-01 ENCOUNTER — Other Ambulatory Visit: Payer: Self-pay

## 2020-08-01 ENCOUNTER — Ambulatory Visit: Payer: Medicaid Other | Attending: Family Medicine

## 2020-08-01 DIAGNOSIS — Z3042 Encounter for surveillance of injectable contraceptive: Secondary | ICD-10-CM

## 2020-08-01 MED ORDER — MEDROXYPROGESTERONE ACETATE 150 MG/ML IM SUSP
150.0000 mg | Freq: Once | INTRAMUSCULAR | Status: AC
  Administered 2020-08-01: 150 mg via INTRAMUSCULAR

## 2020-08-01 NOTE — Progress Notes (Signed)
DOB and Name verified Pt is here for her DEPO injections/ Last Depo-Provera: 08/01/2020 within time interval  Side Effects if any: none verbalized Serum HCG indicated?No Depo-Provera 150 mg IM given in L gluteus muscle .   Injection well tolerated. No reaction noted at the injection site. Explained pt the importance adherence to depo provera perpetual calendar. Education given to pt in regards to use extra protection and possible false negative HGC if antibiotic intake or missed intervals.Verbalized understanding  Next injection scheduled on October 26/

## 2020-09-01 MED FILL — LEVOTHYROXINE SODIUM 100 MC: 100 | 30 days supply | Qty: 30 | Fill #1

## 2020-10-07 MED FILL — LEVOTHYROXINE SODIUM 100 MC: 100 | 30 days supply | Qty: 30 | Fill #2

## 2020-10-17 ENCOUNTER — Ambulatory Visit: Payer: Medicaid Other

## 2020-11-21 ENCOUNTER — Other Ambulatory Visit: Payer: Self-pay

## 2020-11-21 ENCOUNTER — Ambulatory Visit: Payer: Self-pay | Attending: Family Medicine

## 2020-12-18 ENCOUNTER — Ambulatory Visit: Payer: Medicaid Other | Attending: Family Medicine | Admitting: Family Medicine

## 2020-12-18 ENCOUNTER — Encounter: Payer: Self-pay | Admitting: Family Medicine

## 2020-12-18 ENCOUNTER — Other Ambulatory Visit: Payer: Self-pay

## 2020-12-18 VITALS — BP 126/70 | HR 61 | Ht 62.0 in | Wt 189.4 lb

## 2020-12-18 DIAGNOSIS — E669 Obesity, unspecified: Secondary | ICD-10-CM

## 2020-12-18 DIAGNOSIS — Z13228 Encounter for screening for other metabolic disorders: Secondary | ICD-10-CM

## 2020-12-18 DIAGNOSIS — Z1159 Encounter for screening for other viral diseases: Secondary | ICD-10-CM

## 2020-12-18 DIAGNOSIS — E038 Other specified hypothyroidism: Secondary | ICD-10-CM

## 2020-12-18 NOTE — Patient Instructions (Signed)

## 2020-12-18 NOTE — Progress Notes (Signed)
Subjective:  Patient ID: Heather Mejia, female    DOB: 1986/10/27  Age: 34 y.o. MRN: [id number]  CC: Hypothyroidism   HPI Heather Mejia  is a 34 year old female with a history of hypothyroidism here for chronic disease management. She endorses compliance with levothyroxine. Of note she has gained 9 pounds in the last 4 months and has not been exercising much, endorses eating a lot.  She is motivated to working towards losing weight. She has noticed intermittent left lower quadrant pain but is unsure if this is related to her weight gain.  Pain is absent at this time and she has no vaginal discharge, any symptoms. Past Medical History:  Diagnosis Date  . Chlamydia   . Hypothyroidism   . Vaginal Pap smear, abnormal     Past Surgical History:  Procedure Laterality Date  . BILATERAL SALPINGECTOMY Right 07/12/2016   Procedure: SALPINGECTOMY;  Surgeon: Donnamae Jude, MD;  Location: [facility];  Service: Gynecology;  Laterality: Right;  . CESAREAN SECTION N/A 03/17/2016   Procedure: CESAREAN SECTION;  Surgeon: Donnamae Jude, MD;  Location: [facility];  Service: Obstetrics;  Laterality: N/A;  . DIAGNOSTIC LAPAROSCOPY WITH REMOVAL OF ECTOPIC PREGNANCY Right 07/12/2016   Procedure: DIAGNOSTIC LAPAROSCOPY WITH REMOVAL OF ECTOPIC PREGNANCY;  Surgeon: Donnamae Jude, MD;  Location: [facility];  Service: Gynecology;  Laterality: Right;    Family History  Problem Relation Age of Onset  . Cancer Paternal Uncle   . Polydactyly Son        post-axial on right foot; different father from current pregnancy  . Rheum arthritis Sister     No Known Allergies  Outpatient Medications Prior to Visit  Medication Sig Dispense Refill  . levothyroxine (SYNTHROID) 100 MCG tablet Take 1 tablet (100 mcg total) by mouth daily. 30 tablet 6  . ibuprofen (ADVIL,MOTRIN) 600 MG tablet Take 1 tablet (600 mg total) by mouth every 6 (six) hours. (Patient not taking: No sig reported) 60 tablet 2   No  facility-administered medications prior to visit.     ROS Review of Systems  Constitutional: Negative for activity change, appetite change and fatigue.  HENT: Negative for congestion, sinus pressure and sore throat.   Eyes: Negative for visual disturbance.  Respiratory: Negative for cough, chest tightness, shortness of breath and wheezing.   Cardiovascular: Negative for chest pain and palpitations.  Gastrointestinal: Negative for abdominal distention, abdominal pain and constipation.  Endocrine: Negative for polydipsia.  Genitourinary: Negative.  Negative for dysuria and frequency.  Musculoskeletal: Negative.  Negative for arthralgias and back pain.  Skin: Negative for rash.  Neurological: Negative for tremors, light-headedness and numbness.  Hematological: Does not bruise/bleed easily.  Psychiatric/Behavioral: Negative for agitation, behavioral problems and dysphoric mood.    Objective:  BP 126/70   Pulse 61   Ht $R'5\' 2"'xo$  (1.575 m)   Wt 189 lb 6.4 oz (85.9 kg)   SpO2 98%   BMI 34.64 kg/m   BP/Weight 12/18/2020 0/24/0973 04/22/2991  Systolic BP 426 834 196  Diastolic BP 70 66 75  Wt. (Lbs) 189.4 180 177.4  BMI 34.64 32.92 32.45      Physical Exam Constitutional:      Appearance: She is well-developed.  Neck:     Vascular: No JVD.  Cardiovascular:     Rate and Rhythm: Normal rate.     Heart sounds: Normal heart sounds. No murmur heard.   Pulmonary:     Effort: Pulmonary effort is normal.  Breath sounds: Normal breath sounds. No wheezing or rales.  Chest:     Chest wall: No tenderness.  Abdominal:     General: Bowel sounds are normal. There is no distension.     Palpations: Abdomen is soft. There is no mass.     Tenderness: There is no abdominal tenderness.  Musculoskeletal:        General: Normal range of motion.     Right lower leg: No edema.     Left lower leg: No edema.  Neurological:     Mental Status: She is alert and oriented to person, place, and  time.  Psychiatric:        Mood and Affect: Mood normal.     CMP Latest Ref Rng & Units 02/20/2020 01/23/2019 07/12/2017  Glucose 65 - 99 mg/dL 92 84 86  BUN 6 - 20 mg/dL $Remove'11 10 20  'enMzWFJ$ Creatinine 0.57 - 1.00 mg/dL 0.85 0.75 0.89  Sodium 134 - 144 mmol/L 139 138 137  Potassium 3.5 - 5.2 mmol/L 4.6 3.8 4.4  Chloride 96 - 106 mmol/L 101 102 99  CO2 20 - 29 mmol/L $RemoveB'25 23 23  'IVHkseSU$ Calcium 8.7 - 10.2 mg/dL 9.4 9.0 8.8  Total Protein 6.0 - 8.5 g/dL - - 7.3  Total Bilirubin 0.0 - 1.2 mg/dL - - <0.2  Alkaline Phos 39 - 117 IU/L - - 82  AST 0 - 40 IU/L - - 25  ALT 0 - 32 IU/L - - 25    Lipid Panel     Component Value Date/Time   CHOL 159 12/31/2008 2053   TRIG 103 12/31/2008 2053   HDL 36 (L) 12/31/2008 2053   CHOLHDL 4.4 Ratio 12/31/2008 2053   VLDL 21 12/31/2008 2053   LDLCALC 102 (H) 12/31/2008 2053    CBC    Component Value Date/Time   WBC 7.6 12/21/2017 1606   WBC 10.3 07/12/2016 1748   RBC 4.15 12/21/2017 1606   RBC 4.09 07/12/2016 1748   HGB 12.3 12/21/2017 1606   HGB 12.3 10/20/2015 0000   HCT 35.7 12/21/2017 1606   HCT 36 10/20/2015 0000   PLT 359 12/21/2017 1606   PLT 231 10/20/2015 0000   MCV 86 12/21/2017 1606   MCH 29.6 12/21/2017 1606   MCH 24.4 (L) 07/12/2016 1748   MCHC 34.5 12/21/2017 1606   MCHC 29.6 (L) 07/12/2016 1748   RDW 15.5 (H) 12/21/2017 1606   LYMPHSABS 2.4 12/21/2017 1606   EOSABS 0.1 12/21/2017 1606   BASOSABS 0.0 12/21/2017 1606    No results found for: HGBA1C  Assessment & Plan:  1. Other specified hypothyroidism Controlled We will send off thyroid panel and adjust regimen accordingly - T4, free - TSH - CMP14+EGFR - Lipid panel  2. Screening for viral disease - HCV RNA quant rflx ultra or genotyp([facility])  3. Obesity (BMI 30.0-34.9) Counseled on reducing portion sizes, increasing physical activity, avoiding late meals  4. Screening for metabolic disorder - Hemoglobin A1c   No orders of the defined types were placed in this  encounter.   Follow-up: Return in about 6 months (around 06/18/2021) for Hypothyroidism.       Charlott Rakes, MD, FAAFP. [facility] [place], [place]   12/18/2020, 9:00 AM

## 2020-12-19 ENCOUNTER — Other Ambulatory Visit: Payer: Self-pay | Admitting: Family Medicine

## 2020-12-19 DIAGNOSIS — R7989 Other specified abnormal findings of blood chemistry: Secondary | ICD-10-CM

## 2020-12-19 DIAGNOSIS — E039 Hypothyroidism, unspecified: Secondary | ICD-10-CM

## 2020-12-19 MED ORDER — LEVOTHYROXINE SODIUM 100 MCG PO TABS
100.0000 ug | ORAL_TABLET | Freq: Every day | ORAL | 6 refills | Status: DC
Start: 2020-12-19 — End: 2020-12-19

## 2020-12-19 MED FILL — LEVOTHYROXINE SODIUM 100 MC: 100 | 30 days supply | Qty: 30 | Fill #0

## 2020-12-20 LAB — HEMOGLOBIN A1C
Est. average glucose Bld gHb Est-mCnc: 117 mg/dL
Hgb A1c MFr Bld: 5.7 % — ABNORMAL HIGH (ref 4.8–5.6)

## 2020-12-20 LAB — TSH: TSH: 3.36 u[IU]/mL (ref 0.450–4.500)

## 2020-12-20 LAB — CMP14+EGFR
ALT: 100 IU/L — ABNORMAL HIGH (ref 0–32)
AST: 52 IU/L — ABNORMAL HIGH (ref 0–40)
Albumin/Globulin Ratio: 1.5 (ref 1.2–2.2)
Albumin: 4.3 g/dL (ref 3.8–4.8)
Alkaline Phosphatase: 190 IU/L — ABNORMAL HIGH (ref 44–121)
BUN/Creatinine Ratio: 16 (ref 9–23)
BUN: 14 mg/dL (ref 6–20)
Bilirubin Total: 0.5 mg/dL (ref 0.0–1.2)
CO2: 22 mmol/L (ref 20–29)
Calcium: 9.2 mg/dL (ref 8.7–10.2)
Chloride: 101 mmol/L (ref 96–106)
Creatinine, Ser: 0.85 mg/dL (ref 0.57–1.00)
GFR calc Af Amer: 105 mL/min/{1.73_m2} (ref >59)
GFR calc non Af Amer: 91 mL/min/{1.73_m2} (ref >59)
Globulin, Total: 2.9 g/dL (ref 1.5–4.5)
Glucose: 88 mg/dL (ref 65–99)
Potassium: 4.6 mmol/L (ref 3.5–5.2)
Sodium: 137 mmol/L (ref 134–144)
Total Protein: 7.2 g/dL (ref 6.0–8.5)

## 2020-12-20 LAB — HCV RNA QUANT RFLX ULTRA OR GENOTYP: HCV Quant Baseline: NOT DETECTED IU/mL

## 2020-12-20 LAB — T4, FREE: Free T4: 1.29 ng/dL (ref 0.82–1.77)

## 2020-12-20 LAB — LIPID PANEL
Chol/HDL Ratio: 3.7 ratio (ref 0.0–4.4)
Cholesterol, Total: 211 mg/dL — ABNORMAL HIGH (ref 100–199)
HDL: 57 mg/dL (ref >39)
LDL Chol Calc (NIH): 135 mg/dL — ABNORMAL HIGH (ref 0–99)
Triglycerides: 107 mg/dL (ref 0–149)
VLDL Cholesterol Cal: 19 mg/dL (ref 5–40)

## 2021-01-30 MED FILL — LEVOTHYROXINE SODIUM 100 MC: 100 | 30 days supply | Qty: 30 | Fill #1

## 2021-03-09 MED FILL — LEVOTHYROXINE SODIUM 100 MC: 100 | 30 days supply | Qty: 30 | Fill #2

## 2021-04-17 ENCOUNTER — Other Ambulatory Visit: Payer: Self-pay

## 2021-04-17 MED FILL — Levothyroxine Sodium Tab 100 MCG: ORAL | 30 days supply | Qty: 30 | Fill #0 | Status: AC

## 2021-06-01 ENCOUNTER — Other Ambulatory Visit: Payer: Self-pay

## 2021-06-01 MED FILL — Levothyroxine Sodium Tab 100 MCG: ORAL | 30 days supply | Qty: 30 | Fill #1 | Status: AC

## 2021-07-10 ENCOUNTER — Other Ambulatory Visit: Payer: Self-pay

## 2021-07-10 MED FILL — Levothyroxine Sodium Tab 100 MCG: ORAL | 30 days supply | Qty: 30 | Fill #2 | Status: AC

## 2021-09-14 ENCOUNTER — Other Ambulatory Visit: Payer: Self-pay

## 2021-09-14 MED FILL — Levothyroxine Sodium Tab 100 MCG: ORAL | 30 days supply | Qty: 30 | Fill #3 | Status: AC

## 2021-09-28 ENCOUNTER — Ambulatory Visit: Payer: Self-pay | Attending: Family Medicine | Admitting: Family Medicine

## 2021-09-28 ENCOUNTER — Other Ambulatory Visit: Payer: Self-pay

## 2021-09-28 ENCOUNTER — Encounter: Payer: Self-pay | Admitting: Family Medicine

## 2021-09-28 VITALS — BP 145/79 | HR 64 | Ht 62.0 in | Wt 181.0 lb

## 2021-09-28 DIAGNOSIS — N926 Irregular menstruation, unspecified: Secondary | ICD-10-CM

## 2021-09-28 DIAGNOSIS — Z13228 Encounter for screening for other metabolic disorders: Secondary | ICD-10-CM

## 2021-09-28 DIAGNOSIS — E039 Hypothyroidism, unspecified: Secondary | ICD-10-CM

## 2021-09-28 NOTE — Progress Notes (Signed)
Subjective:  Patient ID: Heather Mejia, female    DOB: 1986-08-07  Age: 35 y.o. MRN: [id number]  CC: Hypothyroidism   HPI Heather Mejia is a 35 y.o. year old female with a history of hypothyroidism who presents today for follow-up visit.  Interval History: She had her period last month after being without it for 33 years. She was previously on Depo until 06/2020.  This month she is yet to have her monthly cycle. Her blood pressure is elevated but was normal at previous visits.  She has known history of hypertension. Compliant with levothyroxine for her hypothyroidism. She has no additional concerns today. Past Medical History:  Diagnosis Date   Chlamydia    Hypothyroidism    Vaginal Pap smear, abnormal     Past Surgical History:  Procedure Laterality Date   BILATERAL SALPINGECTOMY Right 07/12/2016   Procedure: SALPINGECTOMY;  Surgeon: Reva Bores, MD;  Location: [facility];  Service: Gynecology;  Laterality: Right;   CESAREAN SECTION N/A 03/17/2016   Procedure: CESAREAN SECTION;  Surgeon: Reva Bores, MD;  Location: [facility];  Service: Obstetrics;  Laterality: N/A;   DIAGNOSTIC LAPAROSCOPY WITH REMOVAL OF ECTOPIC PREGNANCY Right 07/12/2016   Procedure: DIAGNOSTIC LAPAROSCOPY WITH REMOVAL OF ECTOPIC PREGNANCY;  Surgeon: Reva Bores, MD;  Location: [facility];  Service: Gynecology;  Laterality: Right;    Family History  Problem Relation Age of Onset   Cancer Paternal Uncle    Polydactyly Son        post-axial on right foot; different father from current pregnancy   Rheum arthritis Sister     No Known Allergies  Outpatient Medications Prior to Visit  Medication Sig Dispense Refill   levothyroxine (SYNTHROID) 100 MCG tablet TAKE 1 TABLET (100 MCG TOTAL) BY MOUTH DAILY. 30 tablet 6   ibuprofen (ADVIL,MOTRIN) 600 MG tablet Take 1 tablet (600 mg total) by mouth every 6 (six) hours. (Patient not taking: No sig reported) 60 tablet 2   No facility-administered  medications prior to visit.     ROS Review of Systems  Constitutional:  Negative for activity change, appetite change and fatigue.  HENT:  Negative for congestion, sinus pressure and sore throat.   Eyes:  Negative for visual disturbance.  Respiratory:  Negative for cough, chest tightness, shortness of breath and wheezing.   Cardiovascular:  Negative for chest pain and palpitations.  Gastrointestinal:  Negative for abdominal distention, abdominal pain and constipation.  Endocrine: Negative for polydipsia.  Genitourinary:  Positive for menstrual problem. Negative for dysuria and frequency.  Musculoskeletal:  Negative for arthralgias and back pain.  Skin:  Negative for rash.  Neurological:  Negative for tremors, light-headedness and numbness.  Hematological:  Does not bruise/bleed easily.  Psychiatric/Behavioral:  Negative for agitation and behavioral problems.    Objective:  BP (!) 145/79   Pulse 64   Ht 5\' 2"  (1.575 m)   Wt 181 lb (82.1 kg)   SpO2 100%   BMI 33.11 kg/m   BP/Weight 09/28/2021 12/18/2020 06/18/2020  Systolic BP 145 126 111  Diastolic BP 79 70 66  Wt. (Lbs) 181 189.4 180  BMI 33.11 34.64 32.92      Physical Exam Constitutional:      Appearance: She is well-developed.  Cardiovascular:     Rate and Rhythm: Normal rate.     Heart sounds: Normal heart sounds. No murmur heard. Pulmonary:     Effort: Pulmonary effort is normal.     Breath sounds: Normal breath sounds.  No wheezing or rales.  Chest:     Chest wall: No tenderness.  Abdominal:     General: Bowel sounds are normal. There is no distension.     Palpations: Abdomen is soft. There is no mass.     Tenderness: There is no abdominal tenderness.  Musculoskeletal:        General: Normal range of motion.     Right lower leg: No edema.     Left lower leg: No edema.  Neurological:     Mental Status: She is alert and oriented to person, place, and time.  Psychiatric:        Mood and Affect: Mood  normal.    CMP Latest Ref Rng & Units 12/18/2020 02/20/2020 01/23/2019  Glucose 65 - 99 mg/dL 88 92 84  BUN 6 - 20 mg/dL 14 11 10   Creatinine 0.57 - 1.00 mg/dL 7.37 1.06  Sodium 134 - 144 mmol/L 137 139 138  Potassium 3.5 - 5.2 mmol/L 4.6 4.6 3.8  Chloride 96 - 106 mmol/L 101 101 102  CO2 20 - 29 mmol/L 22 25 23   Calcium 8.7 - 10.2 mg/dL 9.2 9.4 9.0  Total Protein 6.0 - 8.5 g/dL 7.2 - -  Total Bilirubin 0.0 - 1.2 mg/dL 0.5 - -  Alkaline Phos 44 - 121 IU/L 190(H) - -  AST 0 - 40 IU/L 52(H) - -  ALT 0 - 32 IU/L 100(H) - -    Lipid Panel     Component Value Date/Time   CHOL 211 (H) 12/18/2020 0905   TRIG 107 12/18/2020 0905   HDL 57 12/18/2020 0905   CHOLHDL 3.7 12/18/2020 0905   CHOLHDL 4.4 Ratio 12/31/2008 2053   VLDL 21 12/31/2008 2053   LDLCALC 135 (H) 12/18/2020 0905    CBC    Component Value Date/Time   WBC 7.6 12/21/2017 1606   WBC 10.3 07/12/2016 1748   RBC 4.15 12/21/2017 1606   RBC 4.09 07/12/2016 1748   HGB 12.3 12/21/2017 1606   HGB 12.3 10/20/2015 0000   HCT 35.7 12/21/2017 1606   HCT 36 10/20/2015 0000   PLT 359 12/21/2017 1606   PLT 231 10/20/2015 0000   MCV 86 12/21/2017 1606   MCH 29.6 12/21/2017 1606   MCH 24.4 (L) 07/12/2016 1748   MCHC 34.5 12/21/2017 1606   MCHC 29.6 (L) 07/12/2016 1748   RDW 15.5 (H) 12/21/2017 1606   LYMPHSABS 2.4 12/21/2017 1606   EOSABS 0.1 12/21/2017 1606   BASOSABS 0.0 12/21/2017 1606    Lab Results  Component Value Date   HGBA1C 5.7 (H) 12/18/2020    Lab Results  Component Value Date   TSH 3.360 12/18/2020    Assessment & Plan:  1. Acquired hypothyroidism Last TSH was normal in 11/2020 We will check thyroid labs and adjust regimen accordingly - Lipid panel - T4, free - TSH - Basic Metabolic Panel  2. Screening for metabolic disorder - Hemoglobin A1c  3. Abnormal menstrual cycle She had been on Depo-Provera which she discontinued a little over a year ago. She has been reassured about the time it  would take for the pathophysiology of the body to return to normal after Depo-Provera use.   No orders of the defined types were placed in this encounter.   Follow-up: Return in about 6 months (around 03/29/2022) for Medical conditions.       12/2020, MD, FAAFP. [facility] [place], [facility] [place]   09/28/2021, 11:52  AM

## 2021-09-28 NOTE — Patient Instructions (Signed)
Exercising to Stay Healthy °To become healthy and stay healthy, it is recommended that you do moderate-intensity and vigorous-intensity exercise. You can tell that you are exercising at a moderate intensity if your heart starts beating faster and you start breathing faster but can still hold a conversation. You can tell that you are exercising at a vigorous intensity if you are breathing much harder and faster and cannot hold a conversation while exercising. °How can exercise benefit me? °Exercising regularly is important. It has many health benefits, such as: °Improving overall fitness, flexibility, and endurance. °Increasing bone density. °Helping with weight control. °Decreasing body fat. °Increasing muscle strength and endurance. °Reducing stress and tension, anxiety, depression, or anger. °Improving overall health. °What guidelines should I follow while exercising? °Before you start a new exercise program, talk with your health care provider. °Do not exercise so much that you hurt yourself, feel dizzy, or get very short of breath. °Wear comfortable clothes and wear shoes with good support. °Drink plenty of water while you exercise to prevent dehydration or heat stroke. °Work out until your breathing and your heartbeat get faster (moderate intensity). °How often should I exercise? °Choose an activity that you enjoy, and set realistic goals. Your health care provider can help you make an activity plan that is individually designed and works best for you. °Exercise regularly as told by your health care provider. This may include: °Doing strength training two times a week, such as: °Lifting weights. °Using resistance bands. °Push-ups. °Sit-ups. °Yoga. °Doing a certain intensity of exercise for a given amount of time. Choose from these options: °A total of 150 minutes of moderate-intensity exercise every week. °A total of 75 minutes of vigorous-intensity exercise every week. °A mix of moderate-intensity and  vigorous-intensity exercise every week. °Children, pregnant women, people who have not exercised regularly, people who are overweight, and older adults may need to talk with a health care provider about what activities are safe to perform. If you have a medical condition, be sure to talk with your health care provider before you start a new exercise program. °What are some exercise ideas? °Moderate-intensity exercise ideas include: °Walking 1 mile (1.6 km) in about 15 minutes. °Biking. °Hiking. °Golfing. °Dancing. °Water aerobics. °Vigorous-intensity exercise ideas include: °Walking 4.5 miles (7.2 km) or more in about 1 hour. °Jogging or running 5 miles (8 km) in about 1 hour. °Biking 10 miles (16.1 km) or more in about 1 hour. °Lap swimming. °Roller-skating or in-line skating. °Cross-country skiing. °Vigorous competitive sports, such as football, basketball, and soccer. °Jumping rope. °Aerobic dancing. °What are some everyday activities that can help me get exercise? °Yard work, such as: °Pushing a lawn mower. °Raking and bagging leaves. °Washing your car. °Pushing a stroller. °Shoveling snow. °Gardening. °Washing windows or floors. °How can I be more active in my day-to-day activities? °Use stairs instead of an elevator. °Take a walk during your lunch break. °If you drive, park your car farther away from your work or school. °If you take public transportation, get off one stop early and walk the rest of the way. °Stand up or walk around during all of your indoor phone calls. °Get up, stretch, and walk around every 30 minutes throughout the day. °Enjoy exercise with a friend. Support to continue exercising will help you keep a regular routine of activity. °Where to find more information °You can find more information about exercising to stay healthy from: °U.S. Department of Health and Human Services: www.hhs.gov °Centers for Disease Control and Prevention (  CDC): www.cdc.gov °Summary °Exercising regularly is  important. It will improve your overall fitness, flexibility, and endurance. °Regular exercise will also improve your overall health. It can help you control your weight, reduce stress, and improve your bone density. °Do not exercise so much that you hurt yourself, feel dizzy, or get very short of breath. °Before you start a new exercise program, talk with your health care provider. °This information is not intended to replace advice given to you by your health care provider. Make sure you discuss any questions you have with your health care provider. °Document Revised: 04/03/2021 Document Reviewed: 04/03/2021 °Elsevier Patient Education © 2022 Elsevier Inc. ° °

## 2021-09-29 ENCOUNTER — Other Ambulatory Visit: Payer: Self-pay

## 2021-09-29 ENCOUNTER — Other Ambulatory Visit: Payer: Self-pay | Admitting: Family Medicine

## 2021-09-29 DIAGNOSIS — E039 Hypothyroidism, unspecified: Secondary | ICD-10-CM

## 2021-09-29 LAB — BASIC METABOLIC PANEL
BUN/Creatinine Ratio: 13 (ref 9–23)
BUN: 11 mg/dL (ref 6–20)
CO2: 23 mmol/L (ref 20–29)
Calcium: 9 mg/dL (ref 8.7–10.2)
Chloride: 102 mmol/L (ref 96–106)
Creatinine, Ser: 0.83 mg/dL (ref 0.57–1.00)
Glucose: 88 mg/dL (ref 70–99)
Potassium: 4.6 mmol/L (ref 3.5–5.2)
Sodium: 138 mmol/L (ref 134–144)
eGFR: 96 mL/min/{1.73_m2} (ref >59)

## 2021-09-29 LAB — LIPID PANEL
Chol/HDL Ratio: 4.4 ratio (ref 0.0–4.4)
Cholesterol, Total: 199 mg/dL (ref 100–199)
HDL: 45 mg/dL (ref >39)
LDL Chol Calc (NIH): 128 mg/dL — ABNORMAL HIGH (ref 0–99)
Triglycerides: 147 mg/dL (ref 0–149)
VLDL Cholesterol Cal: 26 mg/dL (ref 5–40)

## 2021-09-29 LAB — HEMOGLOBIN A1C
Est. average glucose Bld gHb Est-mCnc: 126 mg/dL
Hgb A1c MFr Bld: 6 % — ABNORMAL HIGH (ref 4.8–5.6)

## 2021-09-29 LAB — T4, FREE: Free T4: 1.66 ng/dL (ref 0.82–1.77)

## 2021-09-29 LAB — TSH: TSH: 1.17 u[IU]/mL (ref 0.450–4.500)

## 2021-09-29 MED ORDER — LEVOTHYROXINE SODIUM 100 MCG PO TABS
ORAL_TABLET | Freq: Every day | ORAL | 6 refills | Status: DC
Start: 2021-09-29 — End: 2022-10-07
  Filled 2021-09-29: qty 30, fill #0
  Filled 2021-10-23: qty 30, 30d supply, fill #0
  Filled 2021-12-03: qty 30, 30d supply, fill #1
  Filled 2022-01-12: qty 30, 30d supply, fill #0
  Filled 2022-01-12: qty 30, 30d supply, fill #2
  Filled 2022-02-25: qty 30, 30d supply, fill #1
  Filled 2022-04-20: qty 30, 30d supply, fill #2
  Filled 2022-06-04: qty 30, 30d supply, fill #3
  Filled 2022-08-02: qty 30, 30d supply, fill #4

## 2021-10-16 ENCOUNTER — Ambulatory Visit: Payer: Self-pay | Attending: Family Medicine

## 2021-10-16 ENCOUNTER — Other Ambulatory Visit: Payer: Self-pay

## 2021-10-23 ENCOUNTER — Other Ambulatory Visit: Payer: Self-pay

## 2021-12-03 ENCOUNTER — Other Ambulatory Visit: Payer: Self-pay

## 2022-01-12 ENCOUNTER — Other Ambulatory Visit: Payer: Self-pay

## 2022-02-25 ENCOUNTER — Other Ambulatory Visit: Payer: Self-pay

## 2022-02-25 ENCOUNTER — Encounter: Payer: Self-pay | Admitting: Physician Assistant

## 2022-02-25 ENCOUNTER — Ambulatory Visit: Payer: Self-pay | Attending: Physician Assistant | Admitting: Physician Assistant

## 2022-02-25 VITALS — BP 111/73 | HR 71 | Resp 18 | Ht 62.0 in | Wt 181.5 lb

## 2022-02-25 DIAGNOSIS — Z23 Encounter for immunization: Secondary | ICD-10-CM

## 2022-02-25 DIAGNOSIS — R2231 Localized swelling, mass and lump, right upper limb: Secondary | ICD-10-CM

## 2022-02-25 NOTE — Progress Notes (Signed)
Patient ID: Heather Mejia, female   DOB: 02/25/1986, 36 y.o.   MRN: [id number] ? ? ?Heather Mejia, is a 36 y.o. female ? ?[id number] ? ?[id number] ? ?DOB - 10/11/1986 ? ?Chief Complaint  ?Patient presents with  ? Mass  ?  Under right arm  ?    ? ?Subjective:  ? ?Henley Buel Mejia is a 36 y.o. female here today for Spot under R arm for about 2 months that burns and stings esp if she touches it.  She feels like there is a small lump there.  No constitutional s/sx.  No other lumps. ?No problems updated. ? ?ALLERGIES: ?No Known Allergies ? ?PAST MEDICAL HISTORY: ?Past Medical History:  ?Diagnosis Date  ? Chlamydia   ? Hypothyroidism   ? Vaginal Pap smear, abnormal   ? ? ?MEDICATIONS AT HOME: ?Prior to Admission medications   ?Medication Sig Start Date End Date Taking? Authorizing Provider  ?levothyroxine (SYNTHROID) 100 MCG tablet TAKE 1 TABLET (100 MCG TOTAL) BY MOUTH DAILY. 09/29/21 09/29/22 Yes Hoy Register, MD  ?ibuprofen (ADVIL,MOTRIN) 600 MG tablet Take 1 tablet (600 mg total) by mouth every 6 (six) hours. ?Patient not taking: Reported on 07/12/2017 03/19/16   Tereso Newcomer, MD  ? ? ?ROS: ?Neg HEENT ?Neg resp ?Neg cardiac ?Neg GI ?Neg GU ?Neg MS ?Neg psych ?Neg neuro ? ?Objective:  ? ?Vitals:  ? 02/25/22 0913  ?BP: 111/73  ?Pulse: 71  ?Resp: 18  ?SpO2: 98%  ?Weight: 181 lb 8 oz (82.3 kg)  ?Height: 5\' 2"  (1.575 m)  ? ?Exam ?General appearance : Awake, alert, not in any distress. Speech Clear. Not toxic looking ?HEENT: Atraumatic and Normocephalic ?Neck: Supple, no JVD. No cervical lymphadenopathy.  ?Chest: Good air entry bilaterally, CTAB.  No rales/rhonchi/wheezing ?CVS: S1 S2 regular, no murmurs.  ?R upper inner arm about half way up the arm there is a poorly demarcated but small and palpable soft mass that is 1-2cm ?Extremities: B/L Lower Ext shows no edema, both legs are warm to touch.  Skin is normal and normal color and no swelling or erythema ?Neurology: Awake  alert, and oriented X 3, CN II-XII intact, Non focal ?Skin: No Rash ? ?Data Review ?Lab Results  ?Component Value Date  ? HGBA1C 6.0 (H) 09/28/2021  ? HGBA1C 5.7 (H) 12/18/2020  ? ? ?Assessment & Plan  ? ?1. Need for influenza vaccination ?- Flu Vaccine QUAD 48mo+IM (Fluarix, Fluzone & Alfiuria Quad PF) ? ?2. Lump of skin of right upper extremity ??lipoma vs nerve bundle vs scarring internally ?- 5mo RT UPPER EXTREM LTD SOFT TISSUE NON VASCULAR; Future ? ? ? ?Patient have been counseled extensively about nutrition and exercise. Other issues discussed during this visit include: low cholesterol diet, weight control and daily exercise, foot care, annual eye examinations at Ophthalmology, importance of adherence with medications and regular follow-up. We also discussed long term complications of uncontrolled diabetes and hypertension.  ? ?Return if symptoms worsen or fail to improve. ? ?The patient was given clear instructions to go to ER or return to medical center if symptoms don't improve, worsen or new problems develop. The patient verbalized understanding. The patient was told to call to get lab results if they haven't heard anything in the next week.  ? ? ? ? ?[place], PA-C ?[facility] ?[place], [place] ?[phone]   ?02/25/2022, 9:48 AM  ?

## 2022-02-26 ENCOUNTER — Other Ambulatory Visit: Payer: Self-pay

## 2022-03-08 ENCOUNTER — Ambulatory Visit (HOSPITAL_COMMUNITY)
Admission: RE | Admit: 2022-03-08 | Discharge: 2022-03-08 | Disposition: A | Discharge status: Home / Self Care | Payer: Self-pay | Source: Ambulatory Visit | Attending: Physician Assistant | Admitting: Physician Assistant

## 2022-03-08 ENCOUNTER — Other Ambulatory Visit: Payer: Self-pay

## 2022-03-08 DIAGNOSIS — R2231 Localized swelling, mass and lump, right upper limb: Secondary | ICD-10-CM | POA: Insufficient documentation

## 2022-03-11 ENCOUNTER — Other Ambulatory Visit: Payer: Self-pay | Admitting: Physician Assistant

## 2022-03-11 DIAGNOSIS — R2231 Localized swelling, mass and lump, right upper limb: Secondary | ICD-10-CM

## 2022-03-11 DIAGNOSIS — R9389 Abnormal findings on diagnostic imaging of other specified body structures: Secondary | ICD-10-CM

## 2022-03-23 ENCOUNTER — Ambulatory Visit: Payer: Self-pay | Admitting: Surgery

## 2022-03-25 ENCOUNTER — Encounter: Payer: Self-pay | Admitting: Physician Assistant

## 2022-03-29 ENCOUNTER — Ambulatory Visit: Payer: Self-pay | Admitting: Family Medicine

## 2022-04-20 ENCOUNTER — Other Ambulatory Visit: Payer: Self-pay

## 2022-06-04 ENCOUNTER — Other Ambulatory Visit: Payer: Self-pay

## 2022-08-02 ENCOUNTER — Other Ambulatory Visit: Payer: Self-pay

## 2022-10-07 ENCOUNTER — Telehealth: Payer: Self-pay | Admitting: Emergency Medicine

## 2022-10-07 ENCOUNTER — Telehealth: Payer: Self-pay

## 2022-10-07 ENCOUNTER — Other Ambulatory Visit: Payer: Self-pay

## 2022-10-07 ENCOUNTER — Other Ambulatory Visit: Payer: Self-pay | Admitting: Family Medicine

## 2022-10-07 DIAGNOSIS — E039 Hypothyroidism, unspecified: Secondary | ICD-10-CM

## 2022-10-07 MED ORDER — LEVOTHYROXINE SODIUM 100 MCG PO TABS
100.0000 ug | ORAL_TABLET | Freq: Every day | ORAL | 2 refills | Status: DC
  Filled 2022-10-07 – 2022-10-14 (×2): qty 30, 30d supply, fill #0
  Filled 2022-12-06: qty 30, 30d supply, fill #1
  Filled 2023-01-12 (×2): qty 30, 30d supply, fill #2

## 2022-10-07 NOTE — Telephone Encounter (Signed)
Copied from CRM #[id number]. Topic: General - Inquiry >> Oct 07, 2022  8:34 AM Tiffany B wrote: Reason for CRM: Patient would to renew her orange card 

## 2022-10-07 NOTE — Telephone Encounter (Signed)
Copied from [place] [id number]. Topic: General - Inquiry >> Oct 07, 2022  8:34 AM Tiffany B wrote: Reason for CRM: Patient would to renew her orange card

## 2022-10-07 NOTE — Telephone Encounter (Signed)
Requested medication (s) are due for refill today: yes  Requested medication (s) are on the active medication list: yes  Last refill:  09/29/21 #90/3  Future visit scheduled: yes  Notes to clinic:  Unable to refill per protocol due to failed labs, no updated results.      Requested Prescriptions  Pending Prescriptions Disp Refills   levothyroxine (SYNTHROID) 100 MCG tablet 30 tablet 6    Sig: TAKE 1 TABLET (100 MCG TOTAL) BY MOUTH DAILY.     Endocrinology:  Hypothyroid Agents Failed - 10/07/2022  8:42 AM      Failed - TSH in normal range and within 360 days    TSH  Date Value Ref Range Status  09/28/2021 1.170 0.450 - 4.500 uIU/mL Final         Passed - Valid encounter within last 12 months    Recent Outpatient Visits           7 months ago Need for influenza vaccination   [place], Dionne Bucy, [place]   1 year ago Acquired hypothyroidism   [place], Enobong, MD   1 year ago Other specified hypothyroidism   [place], Enobong, MD   2 years ago Other specified hypothyroidism   [place], Enobong, MD   2 years ago Encounter for initial prescription of injectable contraceptive   [place], MD       Future Appointments             In 3 months Charlott Rakes, MD [place]

## 2022-10-13 ENCOUNTER — Other Ambulatory Visit: Payer: Self-pay

## 2022-10-14 ENCOUNTER — Other Ambulatory Visit: Payer: Self-pay

## 2022-10-18 ENCOUNTER — Other Ambulatory Visit: Payer: Self-pay

## 2022-12-06 ENCOUNTER — Other Ambulatory Visit: Payer: Self-pay

## 2023-01-10 ENCOUNTER — Ambulatory Visit: Payer: Self-pay | Attending: Family Medicine | Admitting: Family Medicine

## 2023-01-10 ENCOUNTER — Encounter: Payer: Self-pay | Admitting: Family Medicine

## 2023-01-10 VITALS — BP 117/75 | HR 60 | Ht 62.0 in | Wt 191.2 lb

## 2023-01-10 DIAGNOSIS — R7303 Prediabetes: Secondary | ICD-10-CM

## 2023-01-10 DIAGNOSIS — N911 Secondary amenorrhea: Secondary | ICD-10-CM

## 2023-01-10 DIAGNOSIS — E039 Hypothyroidism, unspecified: Secondary | ICD-10-CM

## 2023-01-10 NOTE — Patient Instructions (Signed)
Secondary Amenorrhea  Secondary amenorrhea occurs when a female who was previously having menstrual periods has not had them for 3-6 months. A menstrual period is the monthly shedding of the lining of the uterus. The lining of the uterus is made up of blood, tissue, fluid, and mucus. The flow of blood usually occurs during 3-7 consecutive days each month. This condition has many causes. In many cases, treating the underlying cause will return menstrual periods back to a normal cycle. What are the causes? The most common cause of this condition is pregnancy. Other medical conditions that can cause secondary amenorrhea include: Cirrhosis of the liver. Conditions of the blood. Diabetes. Epilepsy. Chronic kidney disease. Polycystic ovary disease. A hormonal imbalance. Ovarian failure. Cystic fibrosis. Early menopause. Cushing syndrome. Thyroid problems. Other causes may include: Malnutrition. Stress or anxiety. Medicines. Extreme obesity. Low body weight or drastic weight loss. Removal of the ovaries or uterus. Contraceptive pills, patches, or vaginal rings. What increases the risk? You are more likely to develop this condition if: You have a family history of this condition. You have an eating disorder. You do extreme athletic training. You have a chronic disease. You abuse substances such as alcohol or cigarettes. What are the signs or symptoms? The main symptom of this condition is a lack of menstrual periods for 3-6 months in a female who previously had menstrual periods. How is this diagnosed? This condition may be diagnosed based on: Your medical history. A physical exam. A pelvic exam to check for problems with your reproductive organs. A procedure to examine the uterus. A measurement of your body mass index (BMI). You may also have other tests, including: Blood tests that measure certain hormones in your body and rule out pregnancy. Urine tests. Imaging tests, such as  an ultrasound, CT scan, or MRI. How is this treated? Treatment for this condition depends on the cause of the amenorrhea. It may involve: Correcting diet-related problems. Treating underlying conditions. Medicines. Lifestyle changes. Surgery. If the condition cannot be corrected, it is sometimes possible to start menstrual periods with medicines. Follow these instructions at home: Lifestyle     Maintain a healthy diet. In general, a healthy diet includes lots of fruits and vegetables, low-fat dairy products, lean meats, and foods that contain fiber. Ask to meet with a registered dietitian for nutrition counseling and meal planning. Maintain a healthy weight. Talk to your health care provider before trying any new diet or exercise plan. Exercise at least 30 minutes 5 or more days each week. Exercising includes brisk walking, yard work, biking, running, swimming, and team sports like basketball and soccer. Ask your health care provider which exercises are safe for you. Get enough sleep. Plan your sleep time to allow for 7-9 hours of sleep each night. Learn to manage stress. Explore relaxation techniques such as meditation, journaling, yoga, or tai chi. General instructions Be aware of changes in your menstrual cycle. Keep a record of when you have your menstrual period. Note the date your period starts, how long it lasts, and any problems you experience. Take over-the-counter and prescription medicines only as told by your health care provider. Keep all follow-up visits. This is important. Contact a health care provider if: Your periods do not return to normal after treatment. Summary Secondary amenorrhea is when a female who was previously having menstrual periods has not gotten her period for 3-6 months. This condition has many causes. In many cases, treating the underlying cause will return menstrual periods back to a  normal cycle. Talk to your health care provider if your periods do not  return to normal after treatment. This information is not intended to replace advice given to you by your health care provider. Make sure you discuss any questions you have with your health care provider. Document Revised: 07/23/2020 Document Reviewed: 07/23/2020 Elsevier Patient Education  2023 Elsevier Inc.  

## 2023-01-10 NOTE — Progress Notes (Signed)
Pt says no period in almost a year.

## 2023-01-10 NOTE — Progress Notes (Signed)
Subjective:  Patient ID: Heather Mejia, female    DOB: Aug 01, 1986  Age: 37 y.o. MRN: [id number]  CC: Hypothyroidism   HPI Heather Mejia is a 37 y.o. year old female with a history of hypothyroidism here for an office visit. Last office visit was in 09/2021.  Interval History:  She has not had a monthly period for over 1 year. States she was on the Depo shot till 2020  and since then had a period twice or thrice and it ceased after. She has not had any abdominal pain, nausea or vomiting. Denies presence of headache so blurry vision.   Endorses adherence with Levothyroxine.  Past Medical History:  Diagnosis Date   Chlamydia    Hypothyroidism    Vaginal Pap smear, abnormal     Past Surgical History:  Procedure Laterality Date   BILATERAL SALPINGECTOMY Right 07/12/2016   Procedure: SALPINGECTOMY;  Surgeon: Reva Bores, MD;  Location: [facility];  Service: Gynecology;  Laterality: Right;   CESAREAN SECTION N/A 03/17/2016   Procedure: CESAREAN SECTION;  Surgeon: Reva Bores, MD;  Location: [facility];  Service: Obstetrics;  Laterality: N/A;   DIAGNOSTIC LAPAROSCOPY WITH REMOVAL OF ECTOPIC PREGNANCY Right 07/12/2016   Procedure: DIAGNOSTIC LAPAROSCOPY WITH REMOVAL OF ECTOPIC PREGNANCY;  Surgeon: Reva Bores, MD;  Location: [facility];  Service: Gynecology;  Laterality: Right;    Family History  Problem Relation Age of Onset   Cancer Paternal Uncle    Polydactyly Son        post-axial on right foot; different father from current pregnancy   Rheum arthritis Sister     Social History   Socioeconomic History   Marital status: Single    Spouse name: Not on file   Number of children: Not on file   Years of education: Not on file   Highest education level: Not on file  Occupational History   Not on file  Tobacco Use   Smoking status: Never   Smokeless tobacco: Never  Vaping Use   Vaping Use: Never used  Substance and Sexual Activity   Alcohol use: No    Drug use: No   Sexual activity: Yes    Birth control/protection: None  Other Topics Concern   Not on file  Social History Narrative   Not on file   Social Determinants of Health   Financial Resource Strain: Not on file  Food Insecurity: Not on file  Transportation Needs: Not on file  Physical Activity: Not on file  Stress: Not on file  Social Connections: Not on file    No Known Allergies  Outpatient Medications Prior to Visit  Medication Sig Dispense Refill   levothyroxine (SYNTHROID) 100 MCG tablet Take 1 tablet (100 mcg total) by mouth daily. 30 tablet 2   ibuprofen (ADVIL,MOTRIN) 600 MG tablet Take 1 tablet (600 mg total) by mouth every 6 (six) hours. (Patient not taking: Reported on 07/12/2017) 60 tablet 2   No facility-administered medications prior to visit.     ROS Review of Systems  Constitutional:  Negative for activity change and appetite change.  HENT:  Negative for sinus pressure and sore throat.   Respiratory:  Negative for chest tightness, shortness of breath and wheezing.   Cardiovascular:  Negative for chest pain and palpitations.  Gastrointestinal:  Negative for abdominal distention, abdominal pain and constipation.  Genitourinary: Negative.   Musculoskeletal: Negative.   Psychiatric/Behavioral:  Negative for behavioral problems and dysphoric mood.     Objective:  BP 117/75   Pulse 60   Ht 5\' 2"  (1.575 m)   Wt 191 lb 3.2 oz (86.7 kg)   SpO2 100%   BMI 34.97 kg/m      01/10/2023    3:31 PM 02/25/2022    9:13 AM 09/28/2021   10:39 AM  BP/Weight  Systolic BP 644 034 742  Diastolic BP 75 73 79  Wt. (Lbs) 191.2 181.5 181  BMI 34.97 kg/m2 33.2 kg/m2 33.11 kg/m2      Physical Exam Constitutional:      Appearance: She is well-developed.  Cardiovascular:     Rate and Rhythm: Normal rate.     Heart sounds: Normal heart sounds. No murmur heard. Pulmonary:     Effort: Pulmonary effort is normal.     Breath sounds: Normal breath sounds. No  wheezing or rales.  Chest:     Chest wall: No tenderness.  Abdominal:     General: Bowel sounds are normal. There is no distension.     Palpations: Abdomen is soft. There is no mass.     Tenderness: There is no abdominal tenderness.  Musculoskeletal:        General: Normal range of motion.     Right lower leg: No edema.     Left lower leg: No edema.  Neurological:     Mental Status: She is alert and oriented to person, place, and time.  Psychiatric:        Mood and Affect: Mood normal.        Latest Ref Rng & Units 09/28/2021   11:44 AM 12/18/2020    9:05 AM 02/20/2020    4:46 PM  CMP  Glucose 70 - 99 mg/dL 88  88  92   BUN 6 - 20 mg/dL 11  14  11    Creatinine 0.57 - 1.00 mg/dL 0.83  0.85  0.85   Sodium 134 - 144 mmol/L 138  137  139   Potassium 3.5 - 5.2 mmol/L 4.6  4.6  4.6   Chloride 96 - 106 mmol/L 102  101  101   CO2 20 - 29 mmol/L 23  22  25    Calcium 8.7 - 10.2 mg/dL 9.0  9.2  9.4   Total Protein 6.0 - 8.5 g/dL  7.2    Total Bilirubin 0.0 - 1.2 mg/dL  0.5    Alkaline Phos 44 - 121 IU/L  190    AST 0 - 40 IU/L  52    ALT 0 - 32 IU/L  100      Lipid Panel     Component Value Date/Time   CHOL 199 09/28/2021 1144   TRIG 147 09/28/2021 1144   HDL 45 09/28/2021 1144   CHOLHDL 4.4 09/28/2021 1144   CHOLHDL 4.4 Ratio 12/31/2008 2053   VLDL 21 12/31/2008 2053   LDLCALC 128 (H) 09/28/2021 1144    CBC    Component Value Date/Time   WBC 7.6 12/21/2017 1606   WBC 10.3 07/12/2016 1748   RBC 4.15 12/21/2017 1606   RBC 4.09 07/12/2016 1748   HGB 12.3 12/21/2017 1606   HGB 12.3 10/20/2015 0000   HCT 35.7 12/21/2017 1606   HCT 36 10/20/2015 0000   PLT 359 12/21/2017 1606   PLT 231 10/20/2015 0000   MCV 86 12/21/2017 1606   MCH 29.6 12/21/2017 1606   MCH 24.4 (L) 07/12/2016 1748   MCHC 34.5 12/21/2017 1606   MCHC 29.6 (L) 07/12/2016 1748   RDW 15.5 (H) 12/21/2017  1606   LYMPHSABS 2.4 12/21/2017 1606   EOSABS 0.1 12/21/2017 1606   BASOSABS 0.0 12/21/2017 1606     Lab Results  Component Value Date   HGBA1C 6.0 (H) 09/28/2021    Lab Results  Component Value Date   TSH 1.170 09/28/2021    Assessment & Plan:  1. Acquired hypothyroidism Controlled Will check thyroid labs and adjust regimen of levothyroxine accordingly - T4, free - TSH - T3 - CMP14+EGFR  2. Amenorrhea, secondary Secondary amenorrhea I would expect her monthly cycles to have returned as she last used Depo close to 4 years ago Send off labs and refer to GYN if unrevealing Due for Pap smear and this will be scheduled after her labs have returned - Prolactin - Estradiol - FSH/LH  3. Prediabetes Labs reveal prediabetes with an A1c of 6.0.  Working on a low carbohydrate diet, exercise, weight loss is recommended in order to prevent progression to type 2 diabetes mellitus.  - Hemoglobin A1c    No orders of the defined types were placed in this encounter.   Follow-up: Return in about 6 months (around 07/11/2023) for Chronic medical conditions.       Charlott Rakes, MD, FAAFP. [facility] [place], [place]   01/10/2023, 5:31 PM

## 2023-01-11 LAB — PROLACTIN: Prolactin: 12.2 ng/mL (ref 4.8–33.4)

## 2023-01-11 LAB — FSH/LH
FSH: 57.6 m[IU]/mL
LH: 37.7 m[IU]/mL

## 2023-01-11 LAB — CMP14+EGFR
ALT: 31 IU/L (ref 0–32)
AST: 28 IU/L (ref 0–40)
Albumin/Globulin Ratio: 1.3 (ref 1.2–2.2)
Albumin: 4.5 g/dL (ref 3.9–4.9)
Alkaline Phosphatase: 139 IU/L — ABNORMAL HIGH (ref 44–121)
BUN/Creatinine Ratio: 19 (ref 9–23)
BUN: 16 mg/dL (ref 6–20)
Bilirubin Total: <0.2 mg/dL (ref 0.0–1.2)
CO2: 20 mmol/L (ref 20–29)
Calcium: 9.3 mg/dL (ref 8.7–10.2)
Chloride: 102 mmol/L (ref 96–106)
Creatinine, Ser: 0.86 mg/dL (ref 0.57–1.00)
Globulin, Total: 3.4 g/dL (ref 1.5–4.5)
Glucose: 92 mg/dL (ref 70–99)
Potassium: 4.2 mmol/L (ref 3.5–5.2)
Sodium: 142 mmol/L (ref 134–144)
Total Protein: 7.9 g/dL (ref 6.0–8.5)
eGFR: 91 mL/min/{1.73_m2} (ref >59)

## 2023-01-11 LAB — HEMOGLOBIN A1C
Est. average glucose Bld gHb Est-mCnc: 117 mg/dL
Hgb A1c MFr Bld: 5.7 % — ABNORMAL HIGH (ref 4.8–5.6)

## 2023-01-11 LAB — ESTRADIOL: Estradiol: <5 pg/mL

## 2023-01-11 LAB — T4, FREE: Free T4: 1.3 ng/dL (ref 0.82–1.77)

## 2023-01-11 LAB — T3: T3, Total: 111 ng/dL (ref 71–180)

## 2023-01-11 LAB — TSH: TSH: 2.43 u[IU]/mL (ref 0.450–4.500)

## 2023-01-12 ENCOUNTER — Other Ambulatory Visit: Payer: Self-pay

## 2023-01-12 ENCOUNTER — Other Ambulatory Visit: Payer: Self-pay | Admitting: Family Medicine

## 2023-01-12 DIAGNOSIS — N911 Secondary amenorrhea: Secondary | ICD-10-CM

## 2023-01-12 DIAGNOSIS — E039 Hypothyroidism, unspecified: Secondary | ICD-10-CM

## 2023-01-12 MED ORDER — LEVOTHYROXINE SODIUM 100 MCG PO TABS
100.0000 ug | ORAL_TABLET | Freq: Every day | ORAL | 1 refills | Status: DC
  Filled 2023-01-12: qty 90, 90d supply, fill #0
  Filled 2023-04-09 – 2023-04-12 (×2): qty 90, 90d supply, fill #1

## 2023-01-14 ENCOUNTER — Other Ambulatory Visit: Payer: Self-pay

## 2023-04-09 ENCOUNTER — Other Ambulatory Visit (HOSPITAL_COMMUNITY): Payer: Self-pay

## 2023-04-12 ENCOUNTER — Other Ambulatory Visit (HOSPITAL_COMMUNITY): Payer: Self-pay

## 2023-04-12 ENCOUNTER — Other Ambulatory Visit: Payer: Self-pay

## 2023-07-11 ENCOUNTER — Ambulatory Visit: Payer: Self-pay | Attending: Family Medicine | Admitting: Family Medicine

## 2023-07-11 ENCOUNTER — Encounter: Payer: Self-pay | Admitting: Family Medicine

## 2023-07-11 VITALS — BP 110/73 | HR 60 | Temp 98.0°F | Ht 62.0 in | Wt 191.8 lb

## 2023-07-11 DIAGNOSIS — E039 Hypothyroidism, unspecified: Secondary | ICD-10-CM

## 2023-07-11 DIAGNOSIS — N6459 Other signs and symptoms in breast: Secondary | ICD-10-CM

## 2023-07-11 DIAGNOSIS — N911 Secondary amenorrhea: Secondary | ICD-10-CM

## 2023-07-11 DIAGNOSIS — R7303 Prediabetes: Secondary | ICD-10-CM

## 2023-07-11 LAB — POCT GLYCOSYLATED HEMOGLOBIN (HGB A1C): HbA1c, POC (prediabetic range): 5.7 % (ref 5.7–6.4)

## 2023-07-11 NOTE — Progress Notes (Signed)
Left nipple is different from right

## 2023-07-11 NOTE — Progress Notes (Signed)
Subjective:  Patient ID: Heather Mejia, female    DOB: [birth year]  Age: 37 y.o. MRN: [id number]  CC: Hypothyroidism   HPI Sharona Rovner is a 37 y.o. year old female with a history of hypothyroidism here for an office visit.   Interval History: Discussed the use of AI scribe software for clinical note transcription with the patient, who gave verbal consent to proceed.   The patient presents with longstanding concerns about different nipple sizes, specifically an inverted nipple on one breast. She reports that this has been a consistent feature for several years and deny any recent changes, breast masses, or breast pain. She also deny any family history of breast disease.  In addition, the patient reports amenorrhea for the past four years, since she stopped using Depo-Provera. She deny any recent pregnancies since her last one in 2017, after which she started using Depo-Provera. She have not had a menstrual cycle since discontinuing the contraceptive. Blood work including FSH, LH, Prolactin were normal.  The patient is currently on levothyroxine for hypothyroidism and is compliant with the medication. She is overdue for a Pap smear.   Past Medical History:  Diagnosis Date   Chlamydia    Hypothyroidism    Vaginal Pap smear, abnormal     Past Surgical History:  Procedure Laterality Date   BILATERAL SALPINGECTOMY Right 07/12/2016   Procedure: SALPINGECTOMY;  Surgeon: Reva Bores, MD;  Location: [facility];  Service: Gynecology;  Laterality: Right;   CESAREAN SECTION N/A 03/17/2016   Procedure: CESAREAN SECTION;  Surgeon: Reva Bores, MD;  Location: [facility];  Service: Obstetrics;  Laterality: N/A;   DIAGNOSTIC LAPAROSCOPY WITH REMOVAL OF ECTOPIC PREGNANCY Right 07/12/2016   Procedure: DIAGNOSTIC LAPAROSCOPY WITH REMOVAL OF ECTOPIC PREGNANCY;  Surgeon: Reva Bores, MD;  Location: [facility];  Service: Gynecology;  Laterality: Right;    Family History  Problem  Relation Age of Onset   Cancer Paternal Uncle    Polydactyly Son        post-axial on right foot; different father from current pregnancy   Rheum arthritis Sister     Social History   Socioeconomic History   Marital status: Single    Spouse name: Not on file   Number of children: Not on file   Years of education: Not on file   Highest education level: Not on file  Occupational History   Not on file  Tobacco Use   Smoking status: Never   Smokeless tobacco: Never  Vaping Use   Vaping status: Never Used  Substance and Sexual Activity   Alcohol use: No   Drug use: No   Sexual activity: Yes    Birth control/protection: None  Other Topics Concern   Not on file  Social History Narrative   Not on file   Social Determinants of Health   Financial Resource Strain: Not on file  Food Insecurity: Not on file  Transportation Needs: Not on file  Physical Activity: Not on file  Stress: Not on file  Social Connections: Not on file    No Known Allergies  Outpatient Medications Prior to Visit  Medication Sig Dispense Refill   levothyroxine (SYNTHROID) 100 MCG tablet Take 1 tablet (100 mcg total) by mouth daily. 90 tablet 1   ibuprofen (ADVIL,MOTRIN) 600 MG tablet Take 1 tablet (600 mg total) by mouth every 6 (six) hours. (Patient not taking: Reported on 07/12/2017) 60 tablet 2   No facility-administered medications prior to visit.  ROS Review of Systems  Constitutional:  Negative for activity change and appetite change.  HENT:  Negative for sinus pressure and sore throat.   Respiratory:  Negative for chest tightness, shortness of breath and wheezing.   Cardiovascular:  Negative for chest pain and palpitations.  Gastrointestinal:  Negative for abdominal distention, abdominal pain and constipation.  Genitourinary: Negative.   Musculoskeletal: Negative.   Psychiatric/Behavioral:  Negative for behavioral problems and dysphoric mood.     Objective:  BP 110/73   Pulse 60    Temp 98 F (36.7 C) (Oral)   Ht 5\' 2"  (1.575 m)   Wt 191 lb 12.8 oz (87 kg)   SpO2 100%   BMI 35.08 kg/m      07/11/2023    4:06 PM 01/10/2023    3:31 PM 02/25/2022    9:13 AM  BP/Weight  Systolic BP 110 117 111  Diastolic BP 73 75 73  Wt. (Lbs) 191.8 191.2 181.5  BMI 35.08 kg/m2 34.97 kg/m2 33.2 kg/m2      Physical Exam Constitutional:      Appearance: She is well-developed.  Cardiovascular:     Rate and Rhythm: Normal rate.     Heart sounds: Normal heart sounds. No murmur heard. Pulmonary:     Effort: Pulmonary effort is normal.     Breath sounds: Normal breath sounds. No wheezing or rales.  Chest:     Chest wall: No tenderness.  Breasts:    Right: No swelling, inverted nipple, mass or tenderness.     Left: Inverted nipple present. No swelling, mass or tenderness.  Abdominal:     General: Bowel sounds are normal. There is no distension.     Palpations: Abdomen is soft. There is no mass.     Tenderness: There is no abdominal tenderness.  Musculoskeletal:        General: Normal range of motion.     Right lower leg: No edema.     Left lower leg: No edema.  Neurological:     Mental Status: She is alert and oriented to person, place, and time.  Psychiatric:        Mood and Affect: Mood normal.        Latest Ref Rng & Units 01/10/2023    4:16 PM 09/28/2021   11:44 AM 12/18/2020    9:05 AM  CMP  Glucose 70 - 99 mg/dL 92  88  88   BUN 6 - 20 mg/dL 16  11  14    Creatinine 0.57 - 1.00 mg/dL 2.84  1.32  4.40   Sodium 134 - 144 mmol/L 142  138  137   Potassium 3.5 - 5.2 mmol/L 4.2  4.6  4.6   Chloride 96 - 106 mmol/L 102  102  101   CO2 20 - 29 mmol/L 20  23  22    Calcium 8.7 - 10.2 mg/dL 9.3  9.0  9.2   Total Protein 6.0 - 8.5 g/dL 7.9   7.2   Total Bilirubin 0.0 - 1.2 mg/dL <1.0   0.5   Alkaline Phos 44 - 121 IU/L 139   190   AST 0 - 40 IU/L 28   52   ALT 0 - 32 IU/L 31   100     Lipid panel     Component Value Date/Time   CHOL 199 09/28/2021 1144   TRIG  147 09/28/2021 1144   HDL 45 09/28/2021 1144   CHOLHDL 4.4 09/28/2021 1144   CHOLHDL 4.4 Ratio  12/31/2008 2053   VLDL 21 12/31/2008 2053   LDLCALC 128 (H) 09/28/2021 1144    CBC    Component Value Date/Time   WBC 7.6 12/21/2017 1606   WBC 10.3 07/12/2016 1748   RBC 4.15 12/21/2017 1606   RBC 4.09 07/12/2016 1748   HGB 12.3 12/21/2017 1606   HGB 12.3 10/20/2015 0000   HCT 35.7 12/21/2017 1606   HCT 36 10/20/2015 0000   PLT 359 12/21/2017 1606   PLT 231 10/20/2015 0000   MCV 86 12/21/2017 1606   MCH 29.6 12/21/2017 1606   MCH 24.4 (L) 07/12/2016 1748   MCHC 34.5 12/21/2017 1606   MCHC 29.6 (L) 07/12/2016 1748   RDW 15.5 (H) 12/21/2017 1606   LYMPHSABS 2.4 12/21/2017 1606   EOSABS 0.1 12/21/2017 1606   BASOSABS 0.0 12/21/2017 1606    Lab Results  Component Value Date   HGBA1C 5.7 07/11/2023    Lab Results  Component Value Date   TSH 2.430 01/10/2023    Assessment & Plan:      Inverted Nipple: Chronic condition with no associated breast masses, pain, or family history of breast disease. No new changes noted. -Monitor for any changes and report if any new symptoms develop.  Amenorrhea: No menses for 4 years since last Depo-Provera use. No other associated symptoms. Last pregnancy in 2017. -Hormone levels are normal -Refer to Gynecology for further evaluation and management.  Overdue Pap Smear: Last Pap smear not mentioned, but patient acknowledges being overdue. -Schedule Pap smear.  Hypothyroidism: On Levothyroxine, no new symptoms reported. -Order thyroid panel. -Refill Levothyroxine prescription. -Schedule follow-up in 6 months.           No orders of the defined types were placed in this encounter.   Follow-up: Return in about 1 month (around 08/11/2023) for Pap smear.       Hoy Register, MD, FAAFP. [facility] [place], [place] [phone]   07/11/2023, 4:56 PM

## 2023-07-11 NOTE — Patient Instructions (Signed)
Exercising to Stay Healthy To become healthy and stay healthy, it is recommended that you do moderate-intensity and vigorous-intensity exercise. You can tell that you are exercising at a moderate intensity if your heart starts beating faster and you start breathing faster but can still hold a conversation. You can tell that you are exercising at a vigorous intensity if you are breathing much harder and faster and cannot hold a conversation while exercising. How can exercise benefit me? Exercising regularly is important. It has many health benefits, such as: Improving overall fitness, flexibility, and endurance. Increasing bone density. Helping with weight control. Decreasing body fat. Increasing muscle strength and endurance. Reducing stress and tension, anxiety, depression, or anger. Improving overall health. What guidelines should I follow while exercising? Before you start a new exercise program, talk with your health care provider. Do not exercise so much that you hurt yourself, feel dizzy, or get very short of breath. Wear comfortable clothes and wear shoes with good support. Drink plenty of water while you exercise to prevent dehydration or heat stroke. Work out until your breathing and your heartbeat get faster (moderate intensity). How often should I exercise? Choose an activity that you enjoy, and set realistic goals. Your health care provider can help you make an activity plan that is individually designed and works best for you. Exercise regularly as told by your health care provider. This may include: Doing strength training two times a week, such as: Lifting weights. Using resistance bands. Push-ups. Sit-ups. Yoga. Doing a certain intensity of exercise for a given amount of time. Choose from these options: A total of 150 minutes of moderate-intensity exercise every week. A total of 75 minutes of vigorous-intensity exercise every week. A mix of moderate-intensity and  vigorous-intensity exercise every week. Children, pregnant women, people who have not exercised regularly, people who are overweight, and older adults may need to talk with a health care provider about what activities are safe to perform. If you have a medical condition, be sure to talk with your health care provider before you start a new exercise program. What are some exercise ideas? Moderate-intensity exercise ideas include: Walking 1 mile (1.6 km) in about 15 minutes. Biking. Hiking. Golfing. Dancing. Water aerobics. Vigorous-intensity exercise ideas include: Walking 4.5 miles (7.2 km) or more in about 1 hour. Jogging or running 5 miles (8 km) in about 1 hour. Biking 10 miles (16.1 km) or more in about 1 hour. Lap swimming. Roller-skating or in-line skating. Cross-country skiing. Vigorous competitive sports, such as football, basketball, and soccer. Jumping rope. Aerobic dancing. What are some everyday activities that can help me get exercise? Yard work, such as: Pushing a lawn mower. Raking and bagging leaves. Washing your car. Pushing a stroller. Shoveling snow. Gardening. Washing windows or floors. How can I be more active in my day-to-day activities? Use stairs instead of an elevator. Take a walk during your lunch break. If you drive, park your car farther away from your work or school. If you take public transportation, get off one stop early and walk the rest of the way. Stand up or walk around during all of your indoor phone calls. Get up, stretch, and walk around every 30 minutes throughout the day. Enjoy exercise with a friend. Support to continue exercising will help you keep a regular routine of activity. Where to find more information You can find more information about exercising to stay healthy from: U.S. Department of Health and Human Services: www.hhs.gov Centers for Disease Control and Prevention (  CDC): www.cdc.gov Summary Exercising regularly is  important. It will improve your overall fitness, flexibility, and endurance. Regular exercise will also improve your overall health. It can help you control your weight, reduce stress, and improve your bone density. Do not exercise so much that you hurt yourself, feel dizzy, or get very short of breath. Before you start a new exercise program, talk with your health care provider. This information is not intended to replace advice given to you by your health care provider. Make sure you discuss any questions you have with your health care provider. Document Revised: 04/03/2021 Document Reviewed: 04/03/2021 Elsevier Patient Education  2024 Elsevier Inc.  

## 2023-07-12 ENCOUNTER — Other Ambulatory Visit: Payer: Self-pay | Admitting: Family Medicine

## 2023-07-12 DIAGNOSIS — E039 Hypothyroidism, unspecified: Secondary | ICD-10-CM

## 2023-07-12 LAB — T4, FREE: Free T4: 1.23 ng/dL (ref 0.82–1.77)

## 2023-07-12 LAB — T3: T3, Total: 97 ng/dL (ref 71–180)

## 2023-07-12 LAB — TSH: TSH: 1.71 u[IU]/mL (ref 0.450–4.500)

## 2023-07-12 MED ORDER — LEVOTHYROXINE SODIUM 100 MCG PO TABS
100.0000 ug | ORAL_TABLET | Freq: Every day | ORAL | 1 refills | Status: DC
Start: 2023-07-12 — End: 2024-05-23
  Filled 2023-07-12 – 2023-09-26 (×2): qty 90, 90d supply, fill #0
  Filled 2024-02-06: qty 90, 90d supply, fill #1

## 2023-07-13 ENCOUNTER — Other Ambulatory Visit: Payer: Self-pay

## 2023-07-20 ENCOUNTER — Other Ambulatory Visit: Payer: Self-pay

## 2023-08-17 ENCOUNTER — Ambulatory Visit: Payer: Self-pay | Admitting: Family Medicine

## 2023-09-15 ENCOUNTER — Encounter: Payer: Self-pay | Admitting: Family Medicine

## 2023-09-15 ENCOUNTER — Ambulatory Visit: Payer: Self-pay | Attending: Family Medicine | Admitting: Family Medicine

## 2023-09-15 ENCOUNTER — Other Ambulatory Visit (HOSPITAL_COMMUNITY)
Admission: RE | Admit: 2023-09-15 | Discharge: 2023-09-15 | Disposition: A | Discharge status: Home / Self Care | Payer: Self-pay | Source: Ambulatory Visit | Attending: Family Medicine | Admitting: Family Medicine

## 2023-09-15 VITALS — BP 101/66 | HR 63 | Ht 62.0 in | Wt 192.6 lb

## 2023-09-15 DIAGNOSIS — Z124 Encounter for screening for malignant neoplasm of cervix: Secondary | ICD-10-CM

## 2023-09-15 DIAGNOSIS — Z0000 Encounter for general adult medical examination without abnormal findings: Secondary | ICD-10-CM

## 2023-09-15 DIAGNOSIS — Z113 Encounter for screening for infections with a predominantly sexual mode of transmission: Secondary | ICD-10-CM | POA: Insufficient documentation

## 2023-09-15 NOTE — Progress Notes (Signed)
Subjective:  Patient ID: Heather Mejia, female    DOB: 1986-06-02  Age: 37 y.o. MRN: [id number]  CC: Gynecologic Exam   HPI Heather Mejia is a 37 y.o. year old female with a history of hypothyroidism.  Interval History: Discussed the use of AI scribe software for clinical note transcription with the patient, who gave verbal consent to proceed.   She presents for an annual physical and Pap smear. She reports minimal exercise outside of work, but does walk a lot during work hours. She has a good intake of fruits and vegetables. She does not regularly see a dentist or an eye doctor. She has no visual problems. She has a history of thyroid issues, with the next thyroid visit scheduled in three months.        Past Medical History:  Diagnosis Date   Chlamydia    Hypothyroidism    Vaginal Pap smear, abnormal     Past Surgical History:  Procedure Laterality Date   BILATERAL SALPINGECTOMY Right 07/12/2016   Procedure: SALPINGECTOMY;  Surgeon: Reva Bores, MD;  Location: [facility];  Service: Gynecology;  Laterality: Right;   CESAREAN SECTION N/A 03/17/2016   Procedure: CESAREAN SECTION;  Surgeon: Reva Bores, MD;  Location: [facility];  Service: Obstetrics;  Laterality: N/A;   DIAGNOSTIC LAPAROSCOPY WITH REMOVAL OF ECTOPIC PREGNANCY Right 07/12/2016   Procedure: DIAGNOSTIC LAPAROSCOPY WITH REMOVAL OF ECTOPIC PREGNANCY;  Surgeon: Reva Bores, MD;  Location: [facility];  Service: Gynecology;  Laterality: Right;    Family History  Problem Relation Age of Onset   Cancer Paternal Uncle    Polydactyly Son        post-axial on right foot; different father from current pregnancy   Rheum arthritis Sister     Social History   Socioeconomic History   Marital status: Single    Spouse name: Not on file   Number of children: Not on file   Years of education: Not on file   Highest education level: Not on file  Occupational History   Not on file  Tobacco Use   Smoking  status: Never   Smokeless tobacco: Never  Vaping Use   Vaping status: Never Used  Substance and Sexual Activity   Alcohol use: No   Drug use: No   Sexual activity: Yes    Birth control/protection: None  Other Topics Concern   Not on file  Social History Narrative   Not on file   Social Determinants of Health   Financial Resource Strain: Not on file  Food Insecurity: Not on file  Transportation Needs: Not on file  Physical Activity: Not on file  Stress: Not on file  Social Connections: Not on file    No Known Allergies  Outpatient Medications Prior to Visit  Medication Sig Dispense Refill   ibuprofen (ADVIL,MOTRIN) 600 MG tablet Take 1 tablet (600 mg total) by mouth every 6 (six) hours. (Patient not taking: Reported on 07/12/2017) 60 tablet 2   levothyroxine (SYNTHROID) 100 MCG tablet Take 1 tablet (100 mcg total) by mouth daily. 90 tablet 1   No facility-administered medications prior to visit.     ROS Review of Systems  Constitutional:  Negative for activity change and appetite change.  HENT:  Negative for sinus pressure and sore throat.   Respiratory:  Negative for chest tightness, shortness of breath and wheezing.   Cardiovascular:  Negative for chest pain and palpitations.  Gastrointestinal:  Negative for abdominal distention, abdominal pain and  constipation.  Genitourinary: Negative.   Musculoskeletal: Negative.   Psychiatric/Behavioral:  Negative for behavioral problems and dysphoric mood.     Objective:  BP 101/66   Pulse 63   Ht 5\' 2"  (1.575 m)   Wt 192 lb 9.6 oz (87.4 kg)   SpO2 98%   BMI 35.23 kg/m      09/15/2023    2:43 PM 07/11/2023    4:06 PM 01/10/2023    3:31 PM  BP/Weight  Systolic BP 101 110 117  Diastolic BP 66 73 75  Wt. (Lbs) 192.6 191.8 191.2  BMI 35.23 kg/m2 35.08 kg/m2 34.97 kg/m2      Physical Exam Exam conducted with a chaperone present.  Constitutional:      General: She is not in acute distress.    Appearance: She is  well-developed. She is not diaphoretic.  HENT:     Head: Normocephalic.     Right Ear: External ear normal.     Left Ear: External ear normal.     Nose: Nose normal.  Eyes:     Conjunctiva/sclera: Conjunctivae normal.     Pupils: Pupils are equal, round, and reactive to light.  Neck:     Vascular: No JVD.  Cardiovascular:     Rate and Rhythm: Normal rate and regular rhythm.     Heart sounds: Normal heart sounds. No murmur heard.    No gallop.  Pulmonary:     Effort: Pulmonary effort is normal. No respiratory distress.     Breath sounds: Normal breath sounds. No wheezing or rales.  Chest:     Chest wall: No tenderness.  Breasts:    Right: Normal. No mass, nipple discharge or tenderness.     Left: Normal. No mass, nipple discharge or tenderness.  Abdominal:     General: Bowel sounds are normal. There is no distension.     Palpations: Abdomen is soft. There is no mass.     Tenderness: There is no abdominal tenderness.     Hernia: There is no hernia in the left inguinal area or right inguinal area.  Genitourinary:    General: Normal vulva.     Pubic Area: No rash.      Labia:        Right: No rash.        Left: No rash.      Vagina: Normal.     Cervix: Normal.     Uterus: Normal.      Adnexa: Right adnexa normal and left adnexa normal.       Right: No tenderness.         Left: No tenderness.    Musculoskeletal:        General: No tenderness. Normal range of motion.     Cervical back: Normal range of motion. No tenderness.  Lymphadenopathy:     Upper Body:     Right upper body: No supraclavicular or axillary adenopathy.     Left upper body: No supraclavicular or axillary adenopathy.  Skin:    General: Skin is warm and dry.  Neurological:     Mental Status: She is alert and oriented to person, place, and time.     Deep Tendon Reflexes: Reflexes are normal and symmetric.        Latest Ref Rng & Units 01/10/2023    4:16 PM 09/28/2021   11:44 AM 12/18/2020    9:05 AM   CMP  Glucose 70 - 99 mg/dL 92  88  88  BUN 6 - 20 mg/dL 16  11  14    Creatinine 0.57 - 1.00 mg/dL 1.61  0.96  0.45   Sodium 134 - 144 mmol/L 142  138  137   Potassium 3.5 - 5.2 mmol/L 4.2  4.6  4.6   Chloride 96 - 106 mmol/L 102  102  101   CO2 20 - 29 mmol/L 20  23  22    Calcium 8.7 - 10.2 mg/dL 9.3  9.0  9.2   Total Protein 6.0 - 8.5 g/dL 7.9   7.2   Total Bilirubin 0.0 - 1.2 mg/dL <4.0   0.5   Alkaline Phos 44 - 121 IU/L 139   190   AST 0 - 40 IU/L 28   52   ALT 0 - 32 IU/L 31   100     Lipid Panel     Component Value Date/Time   CHOL 199 09/28/2021 1144   TRIG 147 09/28/2021 1144   HDL 45 09/28/2021 1144   CHOLHDL 4.4 09/28/2021 1144   CHOLHDL 4.4 Ratio 12/31/2008 2053   VLDL 21 12/31/2008 2053   LDLCALC 128 (H) 09/28/2021 1144    CBC    Component Value Date/Time   WBC 7.6 12/21/2017 1606   WBC 10.3 07/12/2016 1748   RBC 4.15 12/21/2017 1606   RBC 4.09 07/12/2016 1748   HGB 12.3 12/21/2017 1606   HGB 12.3 10/20/2015 0000   HCT 35.7 12/21/2017 1606   HCT 36 10/20/2015 0000   PLT 359 12/21/2017 1606   PLT 231 10/20/2015 0000   MCV 86 12/21/2017 1606   MCH 29.6 12/21/2017 1606   MCH 24.4 (L) 07/12/2016 1748   MCHC 34.5 12/21/2017 1606   MCHC 29.6 (L) 07/12/2016 1748   RDW 15.5 (H) 12/21/2017 1606   LYMPHSABS 2.4 12/21/2017 1606   EOSABS 0.1 12/21/2017 1606   BASOSABS 0.0 12/21/2017 1606    Lab Results  Component Value Date   HGBA1C 5.7 07/11/2023    Lab Results  Component Value Date   TSH 1.710 07/11/2023    Assessment & Plan:      Annual Physical Exam Patient is in good health. Discussed the importance of regular exercise, a balanced diet, and regular dental and eye check-ups. -Encourage 150 minutes of exercise per week. -Recommend annual dental check-up. -Continue balanced diet with good intake of fruits and vegetables.  Pap Smear Pap smear performed along with STD screen -Results will be communicated via MyChart when  available.   Influenza Vaccination Patient declined flu shot. -No further action at this time.  Follow-up Thyroid check in three months.          No orders of the defined types were placed in this encounter.   Follow-up: Return in about 3 months (around 12/15/2023) for Chronic medical conditions.       Hoy Register, MD, FAAFP. [facility] [place], [place] [phone]   09/15/2023, 3:04 PM

## 2023-09-15 NOTE — Patient Instructions (Signed)

## 2023-09-16 LAB — CERVICOVAGINAL ANCILLARY ONLY
Bacterial Vaginitis (gardnerella): NEGATIVE
Candida Glabrata: NEGATIVE
Candida Vaginitis: NEGATIVE
Chlamydia: NEGATIVE
Comment: NEGATIVE
Comment: NEGATIVE
Comment: NEGATIVE
Comment: NEGATIVE
Comment: NEGATIVE
Comment: NORMAL
Neisseria Gonorrhea: NEGATIVE
Trichomonas: NEGATIVE

## 2023-09-22 LAB — CYTOLOGY - PAP
Chlamydia: NEGATIVE
Comment: NEGATIVE
Comment: NEGATIVE
Comment: NEGATIVE
Comment: NORMAL
Diagnosis: NEGATIVE
High risk HPV: NEGATIVE
Neisseria Gonorrhea: NEGATIVE
Trichomonas: NEGATIVE

## 2023-09-26 ENCOUNTER — Other Ambulatory Visit: Payer: Self-pay

## 2023-09-29 ENCOUNTER — Other Ambulatory Visit: Payer: Self-pay

## 2024-02-06 ENCOUNTER — Other Ambulatory Visit: Payer: Self-pay

## 2024-04-25 ENCOUNTER — Ambulatory Visit: Payer: Self-pay | Admitting: Physician Assistant

## 2024-05-22 ENCOUNTER — Ambulatory Visit: Payer: Self-pay | Attending: Family Medicine | Admitting: Family Medicine

## 2024-05-22 ENCOUNTER — Encounter: Payer: Self-pay | Admitting: Family Medicine

## 2024-05-22 VITALS — BP 119/73 | HR 61 | Ht 62.0 in | Wt 196.6 lb

## 2024-05-22 DIAGNOSIS — N911 Secondary amenorrhea: Secondary | ICD-10-CM

## 2024-05-22 DIAGNOSIS — Z131 Encounter for screening for diabetes mellitus: Secondary | ICD-10-CM

## 2024-05-22 DIAGNOSIS — N979 Female infertility, unspecified: Secondary | ICD-10-CM

## 2024-05-22 DIAGNOSIS — E039 Hypothyroidism, unspecified: Secondary | ICD-10-CM

## 2024-05-22 DIAGNOSIS — Z13228 Encounter for screening for other metabolic disorders: Secondary | ICD-10-CM

## 2024-05-22 NOTE — Progress Notes (Addendum)
 Subjective:  Patient ID: Heather Mejia, female    DOB: [birth year]  Age: 38 y.o. MRN: [id number]  CC: Medical Management of Chronic Issues (Atopic pregnancy questions)     Discussed the use of AI scribe software for clinical note transcription with the patient, who gave verbal consent to proceed.  History of Present Illness Heather Mejia is a 38 year old female with a history of hypothyroidism who presents with concerns about her surgical history and fertility.  She is concerned about a discrepancy in her surgical history. In 2017, she underwent a right salpingectomy for an ectopic pregnancy, but her records indicate a 'bilateral salpingectomy,' which she believes is incorrect. This discrepancy is significant as she wishes to have more children.  I explained to her that under the notes section it does reveal she had a right salpingectomy and surgical note also revealed a right salpingectomy.  She has not menstruated for several years, which is concerning given her desire to conceive. She consistently takes levothyroxine  but missed her dose on the morning of the visit. She has experienced weight gain and attributes her lack of regular exercise to a busy lifestyle, though she tries to stay active by taking her children to the park.    Past Medical History:  Diagnosis Date   Chlamydia    Hypothyroidism    Vaginal Pap smear, abnormal     Past Surgical History:  Procedure Laterality Date   BILATERAL SALPINGECTOMY Right 07/12/2016   Procedure: SALPINGECTOMY;  Surgeon: Glenys GORMAN Birk, MD;  Location: [facility];  Service: Gynecology;  Laterality: Right;   CESAREAN SECTION N/A 03/17/2016   Procedure: CESAREAN SECTION;  Surgeon: Glenys GORMAN Birk, MD;  Location: [facility];  Service: Obstetrics;  Laterality: N/A;   DIAGNOSTIC LAPAROSCOPY WITH REMOVAL OF ECTOPIC PREGNANCY Right 07/12/2016   Procedure: DIAGNOSTIC LAPAROSCOPY WITH REMOVAL OF ECTOPIC PREGNANCY;  Surgeon: Glenys GORMAN Birk, MD;  Location: [facility];  Service: Gynecology;  Laterality: Right;    Family History  Problem Relation Age of Onset   Cancer Paternal Uncle    Polydactyly Son        post-axial on right foot; different father from current pregnancy   Rheum arthritis Sister     Social History   Socioeconomic History   Marital status: Single    Spouse name: Not on file   Number of children: Not on file   Years of education: Not on file   Highest education level: Not on file  Occupational History   Not on file  Tobacco Use   Smoking status: Never   Smokeless tobacco: Never  Vaping Use   Vaping status: Never Used  Substance and Sexual Activity   Alcohol use: No   Drug use: No   Sexual activity: Yes    Birth control/protection: None  Other Topics Concern   Not on file  Social History Narrative   Not on file   Social Drivers of Health   Financial Resource Strain: Not on file  Food Insecurity: Not on file  Transportation Needs: Not on file  Physical Activity: Not on file  Stress: Not on file  Social Connections: Not on file    No Known Allergies  Outpatient Medications Prior to Visit  Medication Sig Dispense Refill   ibuprofen  (ADVIL ,MOTRIN ) 600 MG tablet Take 1 tablet (600 mg total) by mouth every 6 (six) hours. 60 tablet 2   levothyroxine  (SYNTHROID ) 100 MCG tablet Take 1 tablet (100 mcg total) by mouth daily. 90  tablet 1   No facility-administered medications prior to visit.     ROS Review of Systems  Constitutional:  Negative for activity change and appetite change.  HENT:  Negative for sinus pressure and sore throat.   Respiratory:  Negative for chest tightness, shortness of breath and wheezing.   Cardiovascular:  Negative for chest pain and palpitations.  Gastrointestinal:  Negative for abdominal distention, abdominal pain and constipation.  Genitourinary: Negative.   Musculoskeletal: Negative.   Psychiatric/Behavioral:  Negative for behavioral problems and  dysphoric mood.     Objective:  BP 119/73   Pulse 61   Ht 5' 2 (1.575 m)   Wt 196 lb 9.6 oz (89.2 kg)   SpO2 100%   BMI 35.96 kg/m      05/22/2024    8:33 AM 09/15/2023    2:43 PM 07/11/2023    4:06 PM  BP/Weight  Systolic BP 119 101 110  Diastolic BP 73 66 73  Wt. (Lbs) 196.6 192.6 191.8  BMI 35.96 kg/m2 35.23 kg/m2 35.08 kg/m2      Physical Exam Constitutional:      Appearance: She is well-developed.  Cardiovascular:     Rate and Rhythm: Normal rate.     Heart sounds: Normal heart sounds. No murmur heard. Pulmonary:     Effort: Pulmonary effort is normal.     Breath sounds: Normal breath sounds. No wheezing or rales.  Chest:     Chest wall: No tenderness.  Abdominal:     General: Bowel sounds are normal. There is no distension.     Palpations: Abdomen is soft. There is no mass.     Tenderness: There is no abdominal tenderness.  Musculoskeletal:        General: Normal range of motion.     Right lower leg: No edema.     Left lower leg: No edema.  Neurological:     Mental Status: She is alert and oriented to person, place, and time.  Psychiatric:        Mood and Affect: Mood normal.        Latest Ref Rng & Units 01/10/2023    4:16 PM 09/28/2021   11:44 AM 12/18/2020    9:05 AM  CMP  Glucose 70 - 99 mg/dL 92  88  88   BUN 6 - 20 mg/dL 16  11  14    Creatinine 0.57 - 1.00 mg/dL 9.13  9.16  9.14   Sodium 134 - 144 mmol/L 142  138  137   Potassium 3.5 - 5.2 mmol/L 4.2  4.6  4.6   Chloride 96 - 106 mmol/L 102  102  101   CO2 20 - 29 mmol/L 20  23  22    Calcium 8.7 - 10.2 mg/dL 9.3  9.0  9.2   Total Protein 6.0 - 8.5 g/dL 7.9   7.2   Total Bilirubin 0.0 - 1.2 mg/dL <9.7   0.5   Alkaline Phos 44 - 121 IU/L 139   190   AST 0 - 40 IU/L 28   52   ALT 0 - 32 IU/L 31   100     Lipid Panel     Component Value Date/Time   CHOL 199 09/28/2021 1144   TRIG 147 09/28/2021 1144   HDL 45 09/28/2021 1144   CHOLHDL 4.4 09/28/2021 1144   CHOLHDL 4.4 Ratio 12/31/2008  7946   VLDL 21 12/31/2008 2053   LDLCALC 128 (H) 09/28/2021 1144    CBC  Component Value Date/Time   WBC 7.6 12/21/2017 1606   WBC 10.3 07/12/2016 1748   RBC 4.15 12/21/2017 1606   RBC 4.09 07/12/2016 1748   HGB 12.3 12/21/2017 1606   HGB 12.3 10/20/2015 0000   HCT 35.7 12/21/2017 1606   HCT 36 10/20/2015 0000   PLT 359 12/21/2017 1606   PLT 231 10/20/2015 0000   MCV 86 12/21/2017 1606   MCH 29.6 12/21/2017 1606   MCH 24.4 (L) 07/12/2016 1748   MCHC 34.5 12/21/2017 1606   MCHC 29.6 (L) 07/12/2016 1748   RDW 15.5 (H) 12/21/2017 1606   LYMPHSABS 2.4 12/21/2017 1606   EOSABS 0.1 12/21/2017 1606   BASOSABS 0.0 12/21/2017 1606    Lab Results  Component Value Date   HGBA1C 5.7 07/11/2023    Lab Results  Component Value Date   TSH 1.710 07/11/2023       1. 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 Acquired hypothyroidism Last TSH was normal Will adjust levothyroxine  dose based on thyroid  labs - T4, free - TSH - T3  4. Screening for metabolic disorder - LP+Non-HDL Cholesterol - CMP14+EGFR - CBC with Differential/Platelet  5. Screening for diabetes mellitus - Hemoglobin A1c   No orders of the defined types were placed in this encounter.   Follow-up: Return in about 6 months (around 11/21/2024) for Chronic medical conditions.       Corrina Sabin, MD, FAAFP. [facility] [place], [place] [phone]   05/22/2024, 8:54 AM

## 2024-05-22 NOTE — Patient Instructions (Signed)
 VISIT SUMMARY:  Today, we discussed your concerns about your surgical history and fertility. We addressed the discrepancy in your surgical records and talked about your lack of menstruation and its impact on your desire to conceive. We also reviewed your thyroid  condition and overall health maintenance to optimize your chances of pregnancy.  YOUR PLAN:  -ECTOPIC PREGNANCY: An ectopic pregnancy occurs when a fertilized egg implants outside the uterus, often in a fallopian tube. You had a right salpingectomy in 2017 to remove the affected tube. We explained the discrepancy in your records and will refer you to a gynecologist for a fertility consultation.  -AMENORRHEA: Amenorrhea is the absence of menstruation. Since you have not had a period for several years, we need to evaluate this to help with your fertility planning. You will discuss this further with a gynecologist.  -HYPOTHYROIDISM: Hypothyroidism is when your thyroid  gland does not produce enough thyroid  hormone. You are currently taking levothyroxine . We will order thyroid  function tests to ensure your levels are optimal for pregnancy and adjust your medication if needed.  -GENERAL HEALTH MAINTENANCE: To optimize your health for pregnancy, we will check your thyroid  and kidney function, diabetes status, and overall health. We recommend taking daily folic acid and encourage regular exercise. We will coordinate with your gynecologist for further fertility planning.  INSTRUCTIONS:  Please complete the lab tests today. We will contact you with the results and adjust your treatment as needed. Additionally, a gynecologist will reach out to you to schedule a fertility consultation.

## 2024-05-23 ENCOUNTER — Ambulatory Visit: Payer: Self-pay | Admitting: Family Medicine

## 2024-05-23 ENCOUNTER — Other Ambulatory Visit: Payer: Self-pay

## 2024-05-23 DIAGNOSIS — E039 Hypothyroidism, unspecified: Secondary | ICD-10-CM

## 2024-05-23 LAB — CMP14+EGFR
ALT: 36 IU/L — ABNORMAL HIGH (ref 0–32)
AST: 33 IU/L (ref 0–40)
Albumin: 4.3 g/dL (ref 3.9–4.9)
Alkaline Phosphatase: 166 IU/L — ABNORMAL HIGH (ref 44–121)
BUN/Creatinine Ratio: 18 (ref 9–23)
BUN: 14 mg/dL (ref 6–20)
Bilirubin Total: 0.2 mg/dL (ref 0.0–1.2)
CO2: 23 mmol/L (ref 20–29)
Calcium: 9.2 mg/dL (ref 8.7–10.2)
Chloride: 103 mmol/L (ref 96–106)
Creatinine, Ser: 0.78 mg/dL (ref 0.57–1.00)
Globulin, Total: 3.3 g/dL (ref 1.5–4.5)
Glucose: 98 mg/dL (ref 70–99)
Potassium: 4.7 mmol/L (ref 3.5–5.2)
Sodium: 139 mmol/L (ref 134–144)
Total Protein: 7.6 g/dL (ref 6.0–8.5)
eGFR: 102 mL/min/{1.73_m2} (ref >59)

## 2024-05-23 LAB — CBC WITH DIFFERENTIAL/PLATELET
Basophils Absolute: 0 10*3/uL (ref 0.0–0.2)
Basos: 0 %
EOS (ABSOLUTE): 0.2 10*3/uL (ref 0.0–0.4)
Eos: 2 %
Hematocrit: 39 % (ref 34.0–46.6)
Hemoglobin: 12.8 g/dL (ref 11.1–15.9)
Immature Grans (Abs): 0 10*3/uL (ref 0.0–0.1)
Immature Granulocytes: 0 %
Lymphocytes Absolute: 2.5 10*3/uL (ref 0.7–3.1)
Lymphs: 36 %
MCH: 29.3 pg (ref 26.6–33.0)
MCHC: 32.8 g/dL (ref 31.5–35.7)
MCV: 89 fL (ref 79–97)
Monocytes Absolute: 0.5 10*3/uL (ref 0.1–0.9)
Monocytes: 7 %
Neutrophils Absolute: 3.8 10*3/uL (ref 1.4–7.0)
Neutrophils: 55 %
Platelets: 294 10*3/uL (ref 150–450)
RBC: 4.37 x10E6/uL (ref 3.77–5.28)
RDW: 14 % (ref 11.7–15.4)
WBC: 7 10*3/uL (ref 3.4–10.8)

## 2024-05-23 LAB — LP+NON-HDL CHOLESTEROL
Cholesterol, Total: 194 mg/dL (ref 100–199)
HDL: 47 mg/dL (ref >39)
LDL Chol Calc (NIH): 124 mg/dL — ABNORMAL HIGH (ref 0–99)
Total Non-HDL-Chol (LDL+VLDL): 147 mg/dL — ABNORMAL HIGH (ref 0–129)
Triglycerides: 126 mg/dL (ref 0–149)
VLDL Cholesterol Cal: 23 mg/dL (ref 5–40)

## 2024-05-23 LAB — TSH: TSH: 1.91 u[IU]/mL (ref 0.450–4.500)

## 2024-05-23 LAB — T3: T3, Total: 110 ng/dL (ref 71–180)

## 2024-05-23 LAB — HEMOGLOBIN A1C
Est. average glucose Bld gHb Est-mCnc: 123 mg/dL
Hgb A1c MFr Bld: 5.9 % — ABNORMAL HIGH (ref 4.8–5.6)

## 2024-05-23 LAB — T4, FREE: Free T4: 1.27 ng/dL (ref 0.82–1.77)

## 2024-05-23 MED ORDER — LEVOTHYROXINE SODIUM 100 MCG PO TABS
100.0000 ug | ORAL_TABLET | Freq: Every day | ORAL | 1 refills | Status: DC
  Filled 2024-05-23: qty 90, 90d supply, fill #0
  Filled 2024-11-06: qty 90, 90d supply, fill #1

## 2024-05-24 ENCOUNTER — Other Ambulatory Visit: Payer: Self-pay

## 2024-05-28 ENCOUNTER — Other Ambulatory Visit: Payer: Self-pay

## 2024-11-06 ENCOUNTER — Other Ambulatory Visit: Payer: Self-pay

## 2024-11-21 ENCOUNTER — Encounter: Payer: Self-pay | Admitting: Family Medicine

## 2024-11-21 ENCOUNTER — Ambulatory Visit: Payer: Self-pay | Attending: Family Medicine | Admitting: Family Medicine

## 2024-11-21 VITALS — BP 113/74 | HR 58 | Temp 98.0°F | Ht 62.0 in | Wt 194.4 lb

## 2024-11-21 DIAGNOSIS — E039 Hypothyroidism, unspecified: Secondary | ICD-10-CM

## 2024-11-21 DIAGNOSIS — R7303 Prediabetes: Secondary | ICD-10-CM

## 2024-11-21 DIAGNOSIS — G5601 Carpal tunnel syndrome, right upper limb: Secondary | ICD-10-CM

## 2024-11-21 DIAGNOSIS — R2231 Localized swelling, mass and lump, right upper limb: Secondary | ICD-10-CM

## 2024-11-21 NOTE — Progress Notes (Signed)
 Subjective:  Patient ID: Heather Mejia, female    DOB: 07/18/1986  Age: 38 y.o. MRN: [id number]  CC: Medical Management of Chronic Issues (Tingling in right arm/)     Discussed the use of AI scribe software for clinical note transcription with the patient, who gave verbal consent to proceed.  History of Present Illness Heather Mejia is a 38 year old female with a history of Hypothyroidism who presents with tingling in her right arm.  For about a year she has had mild tingling in her right arm, mainly during sleep or after extensive use of her hand. She does not wake from sleep to shake out the hand, and she denies numbness. 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  She has a bump in her right forearm that she associates with the tingling. A right upper extremity soft tissue ultrasound in 2023 showed a mass thought to be a cyst of fat necrosis.  Ultrasound soft tissue right upper extremity from 2023: IMPRESSION: Hyperechoic subcutaneous lesion within internal anechoic center with associated vascularity measuring 0.8 x 0.5 x 0.6 cm. Imaging features are nonspecific but this favored to represent an epidermal inclusion cyst, fat necrosis, or focal infectious process. Recommend clinical follow-up with repeat ultrasound in 6-12 weeks to assess for interval change.    She has hypothyroidism treated with levothyroxine  and prediabetes with A1c 5.9% in June.      Past Medical History:  Diagnosis Date   Chlamydia    Hypothyroidism    Vaginal Pap smear, abnormal     Past Surgical History:  Procedure Laterality Date   BILATERAL SALPINGECTOMY Right 07/12/2016   Procedure: SALPINGECTOMY;  Surgeon: Glenys GORMAN Birk, MD;  Location: [facility];  Service: Gynecology;  Laterality: Right;   CESAREAN SECTION N/A 03/17/2016   Procedure: CESAREAN SECTION;  Surgeon: Glenys GORMAN Birk, MD;  Location: [facility];  Service: Obstetrics;   Laterality: N/A;   DIAGNOSTIC LAPAROSCOPY WITH REMOVAL OF ECTOPIC PREGNANCY Right 07/12/2016   Procedure: DIAGNOSTIC LAPAROSCOPY WITH REMOVAL OF ECTOPIC PREGNANCY;  Surgeon: Glenys GORMAN Birk, MD;  Location: [facility];  Service: Gynecology;  Laterality: Right;    Family History  Problem Relation Age of Onset   Cancer Paternal Uncle    Polydactyly Son        post-axial on right foot; different father from current pregnancy   Rheum arthritis Sister     Social History   Socioeconomic History   Marital status: Single    Spouse name: Not on file   Number of children: Not on file   Years of education: Not on file   Highest education level: Not on file  Occupational History   Not on file  Tobacco Use   Smoking status: Never   Smokeless tobacco: Never  Vaping Use   Vaping status: Never Used  Substance and Sexual Activity   Alcohol use: No   Drug use: No   Sexual activity: Yes    Birth control/protection: None  Other Topics Concern   Not on file  Social History Narrative   Not on file   Social Drivers of Health   Financial Resource Strain: Not on file  Food Insecurity: Not on file  Transportation Needs: Not on file  Physical Activity: Not on file  Stress: Not on file  Social Connections: Not on file    No Known Allergies  Outpatient Medications Prior to Visit  Medication Sig Dispense Refill   ibuprofen  (ADVIL ,MOTRIN ) 600 MG  tablet Take 1 tablet (600 mg total) by mouth every 6 (six) hours. 60 tablet 2   levothyroxine  (SYNTHROID ) 100 MCG tablet Take 1 tablet (100 mcg total) by mouth daily. 90 tablet 1   No facility-administered medications prior to visit.     ROS Review of Systems  Constitutional:  Negative for activity change and appetite change.  HENT:  Negative for sinus pressure and sore throat.   Respiratory:  Negative for chest tightness, shortness of breath and wheezing.   Cardiovascular:  Negative for chest pain and palpitations.  Gastrointestinal:  Negative for  abdominal distention, abdominal pain and constipation.  Genitourinary: Negative.   Musculoskeletal: Negative.   Neurological:  Positive for numbness.  Psychiatric/Behavioral:  Negative for behavioral problems and dysphoric mood.     Objective:  BP 113/74   Pulse (!) 58   Temp 98 F (36.7 C) (Oral)   Ht 5' 2 (1.575 m)   Wt 194 lb 6.4 oz (88.2 kg)   SpO2 100%   BMI 35.56 kg/m      11/21/2024    8:35 AM 05/22/2024    8:33 AM 09/15/2023    2:43 PM  BP/Weight  Systolic BP 113 119 101  Diastolic BP 74 73 66  Wt. (Lbs) 194.4 196.6 192.6  BMI 35.56 kg/m2 35.96 kg/m2 35.23 kg/m2      Physical Exam Constitutional:      Appearance: She is well-developed.  Cardiovascular:     Rate and Rhythm: Bradycardia present.     Heart sounds: Normal heart sounds. No murmur heard. Pulmonary:     Effort: Pulmonary effort is normal.     Breath sounds: Normal breath sounds. No wheezing or rales.  Chest:     Chest wall: No tenderness.  Abdominal:     General: Bowel sounds are normal. There is no distension.     Palpations: Abdomen is soft. There is no mass.     Tenderness: There is no abdominal tenderness.  Musculoskeletal:        General: Normal range of motion.     Right lower leg: No edema.     Left lower leg: No edema.     Comments: Medial aspect of right cubital fossa edema but no TTP; left is normal Positive tinel and phalen signs in right wrist; left is normal  Neurological:     Mental Status: She is alert and oriented to person, place, and time.  Psychiatric:        Mood and Affect: Mood normal.        Latest Ref Rng & Units 05/22/2024    8:57 AM 01/10/2023    4:16 PM 09/28/2021   11:44 AM  CMP  Glucose 70 - 99 mg/dL 98  92  88   BUN 6 - 20 mg/dL 14  16  11    Creatinine 0.57 - 1.00 mg/dL 9.21  9.13  9.16   Sodium 134 - 144 mmol/L 139  142  138   Potassium 3.5 - 5.2 mmol/L 4.7  4.2  4.6   Chloride 96 - 106 mmol/L 103  102  102   CO2 20 - 29 mmol/L 23  20  23    Calcium 8.7  - 10.2 mg/dL 9.2  9.3  9.0   Total Protein 6.0 - 8.5 g/dL 7.6  7.9    Total Bilirubin 0.0 - 1.2 mg/dL 0.2  <9.7    Alkaline Phos 44 - 121 IU/L 166  139    AST 0 - 40 IU/L  33  28    ALT 0 - 32 IU/L 36  31      Lipid Panel     Component Value Date/Time   CHOL 194 05/22/2024 0857   TRIG 126 05/22/2024 0857   HDL 47 05/22/2024 0857   CHOLHDL 4.4 09/28/2021 1144   CHOLHDL 4.4 Ratio 12/31/2008 2053   VLDL 21 12/31/2008 2053   LDLCALC 124 (H) 05/22/2024 0857    CBC    Component Value Date/Time   WBC 7.0 05/22/2024 0857   WBC 10.3 07/12/2016 1748   RBC 4.37 05/22/2024 0857   RBC 4.09 07/12/2016 1748   HGB 12.8 05/22/2024 0857   HGB 12.3 10/20/2015 0000   HCT 39.0 05/22/2024 0857   HCT 36 10/20/2015 0000   PLT 294 05/22/2024 0857   PLT 231 10/20/2015 0000   MCV 89 05/22/2024 0857   MCH 29.3 05/22/2024 0857   MCH 24.4 (L) 07/12/2016 1748   MCHC 32.8 05/22/2024 0857   MCHC 29.6 (L) 07/12/2016 1748   RDW 14.0 05/22/2024 0857   LYMPHSABS 2.5 05/22/2024 0857   EOSABS 0.2 05/22/2024 0857   BASOSABS 0.0 05/22/2024 0857    Lab Results  Component Value Date   HGBA1C 5.9 (H) 05/22/2024    Lab Results  Component Value Date   TSH 1.910 05/22/2024       Assessment & Plan Carpal tunnel syndrome, right wrist Mild numbness and tingling in the right wrist and forearm, consistent with carpal tunnel syndrome due to nerve compression. No significant pain. Differential includes nerve compression or other soft tissue issues. -DM ruled out with last A1c at prediabetic range of 5.9; will check A1c again today - Recommended wrist splint during sleep. - Provided information on carpal tunnel syndrome and management. - Discussed potential gabapentin use for worsening symptoms, noting drowsiness as a side effect. - Discussed referral to hand surgeon for corticosteroid injection if no improvement with conservative measures.  Acquired hypothyroidism Last TSH was normal Managed with  levothyroxine . Thyroid  function tests pending for dosage adjustment. - Ordered thyroid  function tests. - Will adjust levothyroxine  dosage based on results.  Benign soft tissue neoplasm, right upper limb Previously identified mass suspected to be a cyst or fat necrosis. - Ordered ultrasound of the right upper limb.   Prediabetes Labs reveal prediabetes with an A1c of 5.9.  An A1c of 6.5 is supportive of a diagnosis of type 2 diabetes mellitus.  Working on a low carbohydrate diet, exercise, weight loss is recommended in order to prevent progression to type 2 diabetes mellitus.     No orders of the defined types were placed in this encounter.   Follow-up: Return in about 6 months (around 05/22/2025) for Chronic medical conditions.       Corrina Sabin, MD, FAAFP. [facility] [place], [place] [phone]   11/21/2024, 8:58 AM

## 2024-11-21 NOTE — Patient Instructions (Addendum)
 Placed in [facility]  [address]  [place], [place] [address] [phone]    Pinched Nerve in the Wrist (Carpal Tunnel Syndrome): What to Know  Pinched nerve in the wrist (carpal tunnel syndrome, or CTS) is a nerve problem that causes pain, numbness, and weakness in the wrist, hand, and fingers. The carpal tunnel is a narrow space that is on the palm side of your wrist. Repeated wrist motions or certain diseases may cause swelling in the tunnel. This swelling can pinch the main nerve in the wrist (the median nerve). What are the causes? CTS may be caused by: Moving your hand and wrist over and over again while doing a task. Hurting the wrist. Arthritis. A pocket of fluid (cyst) or a growth (tumor) in the carpal tunnel. Fluid buildup when you are pregnant. Use of tools that vibrate. In some cases, the cause of CTS is not known. What increases the risk? You're more likely to have CTS if: You have a job that makes you do these things: Move your hand firmly over and over again. Work with tools that vibrate, such as drills or sanders. You're female. You have diabetes, obesity, thyroid  problems, or kidney failure. What are the signs or symptoms? Symptoms of this condition include: A tingling feeling in your fingers. You may feel this pain in the thumb, index finger, or middle finger. Tingling or loss of feeling in your hand. Pain in your entire arm. This pain may get worse when you bend your wrist and elbow for a long time. Pain in your wrist that goes up your arm to your shoulder. Pain that goes down into your palm or fingers. Weakness in your hands. You may find it hard to grab and hold items. Your symptoms may feel worse during the night. How is this diagnosed? CTS is diagnosed with a medical history and physical exam. Tests and imaging may also be done to: Check the electrical signals sent by your nerves into the muscles. Check how well electrical signals  pass through your nerves. Check possible causes of your CTS. These include X-rays, ultrasound, and MRI. How is this treated? CTS may be treated with: Lifestyle changes. You will be asked to stop or change the activity that caused your problem. Physical therapy. This may include: Exercises that stretch and strengthen the muscles and tendons in the wrist and hand. Nerve gliding or flossing exercises. These help keep nerves moving smoothly through the tissues around them. Occupational therapy. You'll learn how to use your hand again. Medicines for pain and swelling. You may have injections in your wrist. A wrist splint or brace. Surgery. Follow these instructions at home: If you have a splint or brace: Wear the splint or brace as told. Take it off only if your provider says you can. Check the skin around it every day. Tell your provider if you see problems. Loosen the splint or brace if your fingers tingle, are numb, or turn cold and blue. Keep the splint or brace clean and dry. If the splint or brace isn't waterproof: Do not let it get wet. Cover it when you take a bath or shower. Use a cover that doesn't let any water in. Managing pain, stiffness, and swelling  Use ice or an ice pack as told. If you have a splint or brace that you can take off, remove it only as told. Place a towel between your skin and the ice. Leave the ice on for 20 minutes,  2-3 times a day. If your skin turns red, take off the ice right away to prevent skin damage. The risk of damage is higher if you can't feel pain, heat, or cold. Move your fingers often to reduce stiffness and swelling. General instructions Take your medicines only as told. Rest your wrist and hand from activity that may cause pain. If your CTS is caused by things you do at work, talk with your employer about making changes. For example, you may need a wrist pad to use while typing. Exercise as told. Follow instructions on how to do nerve  gliding or flossing exercises. These help keep nerves in moving smoothly through the tissues around them. Keep all follow-up visits. This is important. Where to find more information American Academy of Orthopedic Surgeons: orthoinfo.aaos.[employer] of Neurological Disorders and Stroke: basicfm.no Contact a health care provider if: You have new symptoms. Your pain is not controlled with medicines. Your symptoms get worse. Get help right away if: Your hand or wrist tingles or is numb, and the symptoms become very bad. This information is not intended to replace advice given to you by your health care provider. Make sure you discuss any questions you have with your health care provider. Document Revised: 10/18/2023 Document Reviewed: 08/05/2023 Elsevier Patient Education  2024 [employer].

## 2024-11-22 ENCOUNTER — Ambulatory Visit: Payer: Self-pay | Admitting: Family Medicine

## 2024-11-22 DIAGNOSIS — E039 Hypothyroidism, unspecified: Secondary | ICD-10-CM

## 2024-11-22 LAB — BASIC METABOLIC PANEL WITH GFR
BUN/Creatinine Ratio: 16 (ref 9–23)
BUN: 12 mg/dL (ref 6–20)
CO2: 25 mmol/L (ref 20–29)
Calcium: 9.4 mg/dL (ref 8.7–10.2)
Chloride: 103 mmol/L (ref 96–106)
Creatinine, Ser: 0.77 mg/dL (ref 0.57–1.00)
Glucose: 94 mg/dL (ref 70–99)
Potassium: 4.2 mmol/L (ref 3.5–5.2)
Sodium: 141 mmol/L (ref 134–144)
eGFR: 102 mL/min/1.73 (ref >59)

## 2024-11-22 LAB — T3: T3, Total: 110 ng/dL (ref 71–180)

## 2024-11-22 LAB — T4, FREE: Free T4: 1.15 ng/dL (ref 0.82–1.77)

## 2024-11-22 LAB — TSH: TSH: 4.1 u[IU]/mL (ref 0.450–4.500)

## 2024-11-22 LAB — HEMOGLOBIN A1C
Est. average glucose Bld gHb Est-mCnc: 117 mg/dL
Hgb A1c MFr Bld: 5.7 % — ABNORMAL HIGH (ref 4.8–5.6)

## 2024-11-22 LAB — VITAMIN B12: Vitamin B-12: 1341 pg/mL — ABNORMAL HIGH (ref 232–1245)

## 2024-11-22 MED ORDER — LEVOTHYROXINE SODIUM 100 MCG PO TABS: 100.0000 ug | ORAL_TABLET | Freq: Every day | ORAL | 1 refills | Status: AC

## 2024-12-03 ENCOUNTER — Ambulatory Visit (HOSPITAL_COMMUNITY)
Admission: RE | Admit: 2024-12-03 | Discharge: 2024-12-03 | Payer: Self-pay | Attending: Family Medicine | Admitting: Family Medicine

## 2024-12-03 DIAGNOSIS — R2231 Localized swelling, mass and lump, right upper limb: Secondary | ICD-10-CM

## 2025-05-22 ENCOUNTER — Ambulatory Visit: Payer: Self-pay | Admitting: Family Medicine
